# Patient Record
Sex: Male | Born: 1960 | Race: White | Hispanic: No | Marital: Married | Smoking: Former smoker
Health system: Southern US, Community
[De-identification: ages and names within clinical notes are randomized; demographics above are authoritative.]

## PROBLEM LIST (undated history)

## (undated) DIAGNOSIS — G4733 Obstructive sleep apnea (adult) (pediatric): Secondary | ICD-10-CM

## (undated) DIAGNOSIS — J449 Chronic obstructive pulmonary disease, unspecified: Secondary | ICD-10-CM

## (undated) DIAGNOSIS — Z7952 Long term (current) use of systemic steroids: Secondary | ICD-10-CM

## (undated) DIAGNOSIS — M1990 Unspecified osteoarthritis, unspecified site: Secondary | ICD-10-CM

## (undated) DIAGNOSIS — Z9981 Dependence on supplemental oxygen: Secondary | ICD-10-CM

## (undated) DIAGNOSIS — I509 Heart failure, unspecified: Secondary | ICD-10-CM

## (undated) DIAGNOSIS — E119 Type 2 diabetes mellitus without complications: Secondary | ICD-10-CM

## (undated) DIAGNOSIS — S32000S Wedge compression fracture of unspecified lumbar vertebra, sequela: Secondary | ICD-10-CM

## (undated) DIAGNOSIS — I10 Essential (primary) hypertension: Secondary | ICD-10-CM

## (undated) DIAGNOSIS — M810 Age-related osteoporosis without current pathological fracture: Secondary | ICD-10-CM

## (undated) DIAGNOSIS — J431 Panlobular emphysema: Secondary | ICD-10-CM

## (undated) DIAGNOSIS — F329 Major depressive disorder, single episode, unspecified: Secondary | ICD-10-CM

## (undated) DIAGNOSIS — E785 Hyperlipidemia, unspecified: Secondary | ICD-10-CM

## (undated) DIAGNOSIS — R569 Unspecified convulsions: Secondary | ICD-10-CM

---

## 2011-10-22 DIAGNOSIS — G959 Disease of spinal cord, unspecified: Secondary | ICD-10-CM | POA: Insufficient documentation

## 2012-03-06 DIAGNOSIS — E042 Nontoxic multinodular goiter: Secondary | ICD-10-CM | POA: Insufficient documentation

## 2012-03-06 DIAGNOSIS — Z720 Tobacco use: Secondary | ICD-10-CM | POA: Insufficient documentation

## 2012-05-04 DIAGNOSIS — M069 Rheumatoid arthritis, unspecified: Secondary | ICD-10-CM | POA: Diagnosis present

## 2016-04-19 DIAGNOSIS — R410 Disorientation, unspecified: Secondary | ICD-10-CM | POA: Insufficient documentation

## 2016-04-19 DIAGNOSIS — G4733 Obstructive sleep apnea (adult) (pediatric): Secondary | ICD-10-CM | POA: Diagnosis present

## 2016-04-19 DIAGNOSIS — J189 Pneumonia, unspecified organism: Secondary | ICD-10-CM | POA: Insufficient documentation

## 2016-04-19 DIAGNOSIS — J9621 Acute and chronic respiratory failure with hypoxia: Secondary | ICD-10-CM | POA: Diagnosis present

## 2016-04-19 DIAGNOSIS — A419 Sepsis, unspecified organism: Secondary | ICD-10-CM | POA: Insufficient documentation

## 2016-04-19 DIAGNOSIS — J9622 Acute and chronic respiratory failure with hypercapnia: Secondary | ICD-10-CM | POA: Diagnosis present

## 2016-04-28 DIAGNOSIS — E876 Hypokalemia: Secondary | ICD-10-CM | POA: Diagnosis present

## 2016-10-26 DIAGNOSIS — N200 Calculus of kidney: Secondary | ICD-10-CM | POA: Insufficient documentation

## 2016-11-15 DIAGNOSIS — N1330 Unspecified hydronephrosis: Secondary | ICD-10-CM | POA: Insufficient documentation

## 2016-11-16 DIAGNOSIS — L98429 Non-pressure chronic ulcer of back with unspecified severity: Secondary | ICD-10-CM | POA: Insufficient documentation

## 2016-11-16 DIAGNOSIS — R0902 Hypoxemia: Secondary | ICD-10-CM | POA: Insufficient documentation

## 2016-11-16 DIAGNOSIS — J449 Chronic obstructive pulmonary disease, unspecified: Secondary | ICD-10-CM | POA: Diagnosis present

## 2017-01-05 DIAGNOSIS — N390 Urinary tract infection, site not specified: Secondary | ICD-10-CM | POA: Insufficient documentation

## 2017-01-06 DIAGNOSIS — D649 Anemia, unspecified: Secondary | ICD-10-CM | POA: Insufficient documentation

## 2017-02-19 DIAGNOSIS — R1013 Epigastric pain: Secondary | ICD-10-CM | POA: Insufficient documentation

## 2017-03-09 DIAGNOSIS — S22000A Wedge compression fracture of unspecified thoracic vertebra, initial encounter for closed fracture: Secondary | ICD-10-CM | POA: Insufficient documentation

## 2017-03-09 DIAGNOSIS — G894 Chronic pain syndrome: Secondary | ICD-10-CM | POA: Diagnosis present

## 2017-04-20 DIAGNOSIS — I10 Essential (primary) hypertension: Secondary | ICD-10-CM | POA: Diagnosis present

## 2017-04-20 DIAGNOSIS — F329 Major depressive disorder, single episode, unspecified: Secondary | ICD-10-CM | POA: Diagnosis present

## 2017-04-20 DIAGNOSIS — F419 Anxiety disorder, unspecified: Secondary | ICD-10-CM | POA: Diagnosis present

## 2017-05-02 DIAGNOSIS — R911 Solitary pulmonary nodule: Secondary | ICD-10-CM | POA: Insufficient documentation

## 2017-05-13 DIAGNOSIS — N400 Enlarged prostate without lower urinary tract symptoms: Secondary | ICD-10-CM | POA: Insufficient documentation

## 2017-05-13 DIAGNOSIS — D7389 Other diseases of spleen: Secondary | ICD-10-CM | POA: Insufficient documentation

## 2017-05-13 DIAGNOSIS — D735 Infarction of spleen: Secondary | ICD-10-CM | POA: Insufficient documentation

## 2017-08-18 DIAGNOSIS — I5032 Chronic diastolic (congestive) heart failure: Secondary | ICD-10-CM | POA: Insufficient documentation

## 2018-01-06 DIAGNOSIS — R5381 Other malaise: Secondary | ICD-10-CM | POA: Insufficient documentation

## 2018-07-07 DIAGNOSIS — Z7409 Other reduced mobility: Secondary | ICD-10-CM | POA: Insufficient documentation

## 2018-07-07 DIAGNOSIS — Z7952 Long term (current) use of systemic steroids: Secondary | ICD-10-CM | POA: Insufficient documentation

## 2018-07-07 DIAGNOSIS — E785 Hyperlipidemia, unspecified: Secondary | ICD-10-CM | POA: Diagnosis present

## 2018-07-07 DIAGNOSIS — J431 Panlobular emphysema: Secondary | ICD-10-CM | POA: Insufficient documentation

## 2018-07-07 DIAGNOSIS — E1169 Type 2 diabetes mellitus with other specified complication: Secondary | ICD-10-CM | POA: Diagnosis present

## 2018-07-07 DIAGNOSIS — T380X5A Adverse effect of glucocorticoids and synthetic analogues, initial encounter: Secondary | ICD-10-CM | POA: Insufficient documentation

## 2018-07-07 DIAGNOSIS — G40909 Epilepsy, unspecified, not intractable, without status epilepticus: Secondary | ICD-10-CM | POA: Insufficient documentation

## 2018-07-08 DIAGNOSIS — R4702 Dysphasia: Secondary | ICD-10-CM | POA: Insufficient documentation

## 2018-10-21 ENCOUNTER — Other Ambulatory Visit: Payer: Self-pay

## 2018-10-21 ENCOUNTER — Inpatient Hospital Stay (HOSPITAL_COMMUNITY)
Admission: EM | Admit: 2018-10-21 | Discharge: 2018-11-01 | DRG: 177 | Disposition: A | Discharge status: Skilled Nursing Facility | Payer: Medicare Other | Source: Skilled Nursing Facility | Attending: Internal Medicine | Admitting: Internal Medicine

## 2018-10-21 ENCOUNTER — Encounter (HOSPITAL_COMMUNITY): Payer: Self-pay

## 2018-10-21 ENCOUNTER — Emergency Department (HOSPITAL_COMMUNITY): Payer: Medicare Other

## 2018-10-21 DIAGNOSIS — R0902 Hypoxemia: Secondary | ICD-10-CM

## 2018-10-21 DIAGNOSIS — Z888 Allergy status to other drugs, medicaments and biological substances status: Secondary | ICD-10-CM | POA: Diagnosis not present

## 2018-10-21 DIAGNOSIS — R0602 Shortness of breath: Secondary | ICD-10-CM

## 2018-10-21 DIAGNOSIS — D696 Thrombocytopenia, unspecified: Secondary | ICD-10-CM | POA: Diagnosis present

## 2018-10-21 DIAGNOSIS — I110 Hypertensive heart disease with heart failure: Secondary | ICD-10-CM | POA: Diagnosis present

## 2018-10-21 DIAGNOSIS — G4733 Obstructive sleep apnea (adult) (pediatric): Secondary | ICD-10-CM | POA: Diagnosis present

## 2018-10-21 DIAGNOSIS — F419 Anxiety disorder, unspecified: Secondary | ICD-10-CM | POA: Diagnosis present

## 2018-10-21 DIAGNOSIS — M069 Rheumatoid arthritis, unspecified: Secondary | ICD-10-CM | POA: Diagnosis present

## 2018-10-21 DIAGNOSIS — Z79890 Hormone replacement therapy (postmenopausal): Secondary | ICD-10-CM | POA: Diagnosis not present

## 2018-10-21 DIAGNOSIS — J1289 Other viral pneumonia: Secondary | ICD-10-CM | POA: Diagnosis present

## 2018-10-21 DIAGNOSIS — E119 Type 2 diabetes mellitus without complications: Secondary | ICD-10-CM | POA: Diagnosis present

## 2018-10-21 DIAGNOSIS — J069 Acute upper respiratory infection, unspecified: Secondary | ICD-10-CM | POA: Diagnosis not present

## 2018-10-21 DIAGNOSIS — Z87891 Personal history of nicotine dependence: Secondary | ICD-10-CM | POA: Diagnosis not present

## 2018-10-21 DIAGNOSIS — D72819 Decreased white blood cell count, unspecified: Secondary | ICD-10-CM | POA: Diagnosis present

## 2018-10-21 DIAGNOSIS — Z7952 Long term (current) use of systemic steroids: Secondary | ICD-10-CM

## 2018-10-21 DIAGNOSIS — Z882 Allergy status to sulfonamides status: Secondary | ICD-10-CM

## 2018-10-21 DIAGNOSIS — I5032 Chronic diastolic (congestive) heart failure: Secondary | ICD-10-CM | POA: Diagnosis present

## 2018-10-21 DIAGNOSIS — Z9981 Dependence on supplemental oxygen: Secondary | ICD-10-CM | POA: Diagnosis not present

## 2018-10-21 DIAGNOSIS — J9621 Acute and chronic respiratory failure with hypoxia: Secondary | ICD-10-CM | POA: Diagnosis present

## 2018-10-21 DIAGNOSIS — J449 Chronic obstructive pulmonary disease, unspecified: Secondary | ICD-10-CM | POA: Diagnosis present

## 2018-10-21 DIAGNOSIS — E1169 Type 2 diabetes mellitus with other specified complication: Secondary | ICD-10-CM | POA: Diagnosis present

## 2018-10-21 DIAGNOSIS — Z7401 Bed confinement status: Secondary | ICD-10-CM | POA: Diagnosis not present

## 2018-10-21 DIAGNOSIS — E669 Obesity, unspecified: Secondary | ICD-10-CM | POA: Diagnosis present

## 2018-10-21 DIAGNOSIS — Z794 Long term (current) use of insulin: Secondary | ICD-10-CM

## 2018-10-21 DIAGNOSIS — M0579 Rheumatoid arthritis with rheumatoid factor of multiple sites without organ or systems involvement: Secondary | ICD-10-CM | POA: Diagnosis not present

## 2018-10-21 DIAGNOSIS — Y95 Nosocomial condition: Secondary | ICD-10-CM | POA: Diagnosis not present

## 2018-10-21 DIAGNOSIS — F32A Depression, unspecified: Secondary | ICD-10-CM | POA: Diagnosis present

## 2018-10-21 DIAGNOSIS — J9622 Acute and chronic respiratory failure with hypercapnia: Secondary | ICD-10-CM | POA: Diagnosis present

## 2018-10-21 DIAGNOSIS — J431 Panlobular emphysema: Secondary | ICD-10-CM | POA: Diagnosis present

## 2018-10-21 DIAGNOSIS — Z7982 Long term (current) use of aspirin: Secondary | ICD-10-CM

## 2018-10-21 DIAGNOSIS — G894 Chronic pain syndrome: Secondary | ICD-10-CM | POA: Diagnosis present

## 2018-10-21 DIAGNOSIS — J1282 Pneumonia due to coronavirus disease 2019: Secondary | ICD-10-CM | POA: Diagnosis present

## 2018-10-21 DIAGNOSIS — Z79899 Other long term (current) drug therapy: Secondary | ICD-10-CM

## 2018-10-21 DIAGNOSIS — E876 Hypokalemia: Secondary | ICD-10-CM | POA: Diagnosis present

## 2018-10-21 DIAGNOSIS — I10 Essential (primary) hypertension: Secondary | ICD-10-CM | POA: Diagnosis present

## 2018-10-21 DIAGNOSIS — J9801 Acute bronchospasm: Secondary | ICD-10-CM | POA: Diagnosis not present

## 2018-10-21 DIAGNOSIS — F329 Major depressive disorder, single episode, unspecified: Secondary | ICD-10-CM | POA: Diagnosis present

## 2018-10-21 DIAGNOSIS — E785 Hyperlipidemia, unspecified: Secondary | ICD-10-CM | POA: Diagnosis present

## 2018-10-21 DIAGNOSIS — U071 COVID-19: Principal | ICD-10-CM | POA: Diagnosis present

## 2018-10-21 HISTORY — DX: Type 2 diabetes mellitus without complications: E11.9

## 2018-10-21 HISTORY — DX: Chronic obstructive pulmonary disease, unspecified: J44.9

## 2018-10-21 HISTORY — DX: Heart failure, unspecified: I50.9

## 2018-10-21 HISTORY — DX: Major depressive disorder, single episode, unspecified: F32.9

## 2018-10-21 HISTORY — DX: Essential (primary) hypertension: I10

## 2018-10-21 HISTORY — DX: Hyperlipidemia, unspecified: E78.5

## 2018-10-21 HISTORY — DX: Wedge compression fracture of unspecified lumbar vertebra, sequela: S32.000S

## 2018-10-21 HISTORY — DX: Panlobular emphysema: J43.1

## 2018-10-21 HISTORY — DX: Unspecified osteoarthritis, unspecified site: M19.90

## 2018-10-21 HISTORY — DX: Obstructive sleep apnea (adult) (pediatric): G47.33

## 2018-10-21 HISTORY — DX: Dependence on supplemental oxygen: Z99.81

## 2018-10-21 HISTORY — DX: Age-related osteoporosis without current pathological fracture: M81.0

## 2018-10-21 HISTORY — DX: Long term (current) use of systemic steroids: Z79.52

## 2018-10-21 HISTORY — DX: Unspecified convulsions: R56.9

## 2018-10-21 LAB — CBC WITH DIFFERENTIAL/PLATELET
Abs Immature Granulocytes: 0.02 10*3/uL (ref 0.00–0.07)
Basophils Absolute: 0 10*3/uL (ref 0.0–0.1)
Basophils Relative: 1 %
Eosinophils Absolute: 0 10*3/uL (ref 0.0–0.5)
Eosinophils Relative: 0 %
HCT: 63.9 % — ABNORMAL HIGH (ref 39.0–52.0)
Hemoglobin: 20.3 g/dL — ABNORMAL HIGH (ref 13.0–17.0)
Immature Granulocytes: 1 %
Lymphocytes Relative: 6 %
Lymphs Abs: 0.2 10*3/uL — ABNORMAL LOW (ref 0.7–4.0)
MCH: 28 pg (ref 26.0–34.0)
MCHC: 31.8 g/dL (ref 30.0–36.0)
MCV: 88 fL (ref 80.0–100.0)
Monocytes Absolute: 0.2 10*3/uL (ref 0.1–1.0)
Monocytes Relative: 7 %
Neutro Abs: 2.9 10*3/uL (ref 1.7–7.7)
Neutrophils Relative %: 85 %
Platelets: 64 10*3/uL — ABNORMAL LOW (ref 150–400)
RBC: 7.26 MIL/uL — ABNORMAL HIGH (ref 4.22–5.81)
RDW: 17.2 % — ABNORMAL HIGH (ref 11.5–15.5)
WBC: 3.4 10*3/uL — ABNORMAL LOW (ref 4.0–10.5)
nRBC: 0 % (ref 0.0–0.2)

## 2018-10-21 LAB — CBG MONITORING, ED: Glucose-Capillary: 239 mg/dL — ABNORMAL HIGH (ref 70–99)

## 2018-10-21 LAB — C-REACTIVE PROTEIN: CRP: 14.4 mg/dL — ABNORMAL HIGH (ref <1.0)

## 2018-10-21 LAB — COMPREHENSIVE METABOLIC PANEL
ALT: 26 U/L (ref 0–44)
AST: 31 U/L (ref 15–41)
Albumin: 2.5 g/dL — ABNORMAL LOW (ref 3.5–5.0)
Alkaline Phosphatase: 55 U/L (ref 38–126)
Anion gap: 12 (ref 5–15)
BUN: 13 mg/dL (ref 6–20)
CO2: 25 mmol/L (ref 22–32)
Calcium: 7.6 mg/dL — ABNORMAL LOW (ref 8.9–10.3)
Chloride: 98 mmol/L (ref 98–111)
Creatinine, Ser: 0.67 mg/dL (ref 0.61–1.24)
GFR calc Af Amer: >60 mL/min (ref >60)
GFR calc non Af Amer: >60 mL/min (ref >60)
Glucose, Bld: 113 mg/dL — ABNORMAL HIGH (ref 70–99)
Potassium: 2.9 mmol/L — ABNORMAL LOW (ref 3.5–5.1)
Sodium: 135 mmol/L (ref 135–145)
Total Bilirubin: 0.6 mg/dL (ref 0.3–1.2)
Total Protein: 5.7 g/dL — ABNORMAL LOW (ref 6.5–8.1)

## 2018-10-21 LAB — LACTIC ACID, PLASMA
Lactic Acid, Venous: 1.1 mmol/L (ref 0.5–1.9)
Lactic Acid, Venous: 0.9 mmol/L (ref 0.5–1.9)

## 2018-10-21 LAB — STREP PNEUMONIAE URINARY ANTIGEN: Strep Pneumo Urinary Antigen: NEGATIVE

## 2018-10-21 LAB — SARS CORONAVIRUS 2 BY RT PCR (HOSPITAL ORDER, PERFORMED IN ~~LOC~~ HOSPITAL LAB): SARS Coronavirus 2: POSITIVE — AB

## 2018-10-21 LAB — ABO/RH: ABO/RH(D): O NEG

## 2018-10-21 LAB — D-DIMER, QUANTITATIVE: D-Dimer, Quant: 2.2 ug/mL-FEU — ABNORMAL HIGH (ref 0.00–0.50)

## 2018-10-21 LAB — SEDIMENTATION RATE: Sed Rate: 2 mm/hr (ref 0–16)

## 2018-10-21 LAB — FERRITIN: Ferritin: 1447 ng/mL — ABNORMAL HIGH (ref 24–336)

## 2018-10-21 LAB — PROCALCITONIN: Procalcitonin: 0.25 ng/mL

## 2018-10-21 MED ORDER — POLYETHYLENE GLYCOL 3350 17 G PO PACK
17.0000 g | PACK | Freq: Every day | ORAL | Status: DC
  Administered 2018-10-25 – 2018-11-01 (×7): 17 g via ORAL
  Filled 2018-10-21 (×10): qty 1

## 2018-10-21 MED ORDER — POTASSIUM CHLORIDE CRYS ER 20 MEQ PO TBCR
40.0000 meq | EXTENDED_RELEASE_TABLET | Freq: Two times a day (BID) | ORAL | Status: DC
  Administered 2018-10-21 – 2018-11-01 (×22): 40 meq via ORAL
  Filled 2018-10-21 (×23): qty 2

## 2018-10-21 MED ORDER — DEXAMETHASONE SODIUM PHOSPHATE 10 MG/ML IJ SOLN
10.0000 mg | Freq: Once | INTRAMUSCULAR | Status: AC
  Administered 2018-10-21: 09:00:00 10 mg via INTRAVENOUS
  Filled 2018-10-21: qty 1

## 2018-10-21 MED ORDER — GABAPENTIN 800 MG PO TABS: 800.0000 mg | ORAL_TABLET | Freq: Two times a day (BID) | ORAL | Status: DC

## 2018-10-21 MED ORDER — INSULIN GLARGINE 100 UNIT/ML ~~LOC~~ SOLN
10.0000 [IU] | Freq: Every day | SUBCUTANEOUS | Status: DC
  Administered 2018-10-22 – 2018-10-30 (×9): 10 [IU] via SUBCUTANEOUS
  Filled 2018-10-21 (×10): qty 0.1

## 2018-10-21 MED ORDER — ENOXAPARIN SODIUM 40 MG/0.4ML ~~LOC~~ SOLN
40.0000 mg | SUBCUTANEOUS | Status: DC
  Administered 2018-10-22 – 2018-10-31 (×10): 40 mg via SUBCUTANEOUS
  Filled 2018-10-21 (×10): qty 0.4

## 2018-10-21 MED ORDER — SODIUM CHLORIDE 0.9 % IV SOLN
INTRAVENOUS | Status: DC
  Administered 2018-10-21 – 2018-10-22 (×2): via INTRAVENOUS

## 2018-10-21 MED ORDER — SODIUM CHLORIDE 0.9 % IV SOLN
2.0000 g | INTRAVENOUS | Status: AC
  Administered 2018-10-21 – 2018-10-23 (×3): 2 g via INTRAVENOUS
  Filled 2018-10-21 (×3): qty 20

## 2018-10-21 MED ORDER — SODIUM CHLORIDE 0.9 % IV SOLN
250.0000 mL | INTRAVENOUS | Status: DC | PRN
  Administered 2018-10-30: 14:00:00 250 mL via INTRAVENOUS

## 2018-10-21 MED ORDER — SODIUM CHLORIDE 0.9 % IV SOLN
100.0000 mg | INTRAVENOUS | Status: AC
  Administered 2018-10-22 – 2018-10-25 (×4): 100 mg via INTRAVENOUS
  Filled 2018-10-21 (×4): qty 20

## 2018-10-21 MED ORDER — POLYVINYL ALCOHOL 1.4 % OP SOLN
2.0000 [drp] | OPHTHALMIC | Status: DC | PRN
  Filled 2018-10-21: qty 15

## 2018-10-21 MED ORDER — GUAIFENESIN-DM 100-10 MG/5ML PO SYRP
10.0000 mL | ORAL_SOLUTION | ORAL | Status: DC | PRN
  Administered 2018-10-24: 05:00:00 10 mL via ORAL
  Filled 2018-10-21 (×2): qty 10

## 2018-10-21 MED ORDER — SODIUM CHLORIDE 0.9 % IV SOLN
200.0000 mg | Freq: Once | INTRAVENOUS | Status: AC
  Administered 2018-10-21: 200 mg via INTRAVENOUS
  Filled 2018-10-21: qty 40

## 2018-10-21 MED ORDER — ASPIRIN 325 MG PO TABS
325.0000 mg | ORAL_TABLET | Freq: Every day | ORAL | Status: DC
  Administered 2018-10-21 – 2018-11-01 (×12): 325 mg via ORAL
  Filled 2018-10-21 (×13): qty 1

## 2018-10-21 MED ORDER — LEVETIRACETAM 250 MG PO TABS
1000.0000 mg | ORAL_TABLET | Freq: Three times a day (TID) | ORAL | Status: DC
  Administered 2018-10-21 – 2018-11-01 (×32): 1000 mg via ORAL
  Filled 2018-10-21 (×20): qty 4
  Filled 2018-10-21: qty 2
  Filled 2018-10-21 (×4): qty 4
  Filled 2018-10-21: qty 2
  Filled 2018-10-21 (×6): qty 4

## 2018-10-21 MED ORDER — CARVEDILOL 6.25 MG PO TABS
6.2500 mg | ORAL_TABLET | Freq: Two times a day (BID) | ORAL | Status: DC
  Administered 2018-10-22 – 2018-10-28 (×13): 6.25 mg via ORAL
  Filled 2018-10-21: qty 2
  Filled 2018-10-21 (×4): qty 1
  Filled 2018-10-21 (×2): qty 2
  Filled 2018-10-21 (×2): qty 1
  Filled 2018-10-21 (×2): qty 2
  Filled 2018-10-21 (×3): qty 1
  Filled 2018-10-21 (×2): qty 2
  Filled 2018-10-21 (×3): qty 1
  Filled 2018-10-21 (×4): qty 2
  Filled 2018-10-21 (×3): qty 1

## 2018-10-21 MED ORDER — MORPHINE SULFATE ER 30 MG PO TBCR
60.0000 mg | EXTENDED_RELEASE_TABLET | Freq: Three times a day (TID) | ORAL | Status: DC
  Administered 2018-10-21 – 2018-11-01 (×33): 60 mg via ORAL
  Filled 2018-10-21 (×31): qty 2
  Filled 2018-10-21: qty 4

## 2018-10-21 MED ORDER — MORPHINE SULFATE (PF) 4 MG/ML IV SOLN
4.0000 mg | Freq: Once | INTRAVENOUS | Status: AC
  Administered 2018-10-21: 4 mg via INTRAVENOUS
  Filled 2018-10-21: qty 1

## 2018-10-21 MED ORDER — DIPHENHYDRAMINE HCL 25 MG PO CAPS: 25.0000 mg | ORAL_CAPSULE | Freq: Every day | ORAL | Status: DC | PRN

## 2018-10-21 MED ORDER — ALBUTEROL SULFATE HFA 108 (90 BASE) MCG/ACT IN AERS
2.0000 | INHALATION_SPRAY | RESPIRATORY_TRACT | Status: DC | PRN
  Administered 2018-10-23 – 2018-10-30 (×11): 2 via RESPIRATORY_TRACT
  Filled 2018-10-21: qty 6.7

## 2018-10-21 MED ORDER — SODIUM CHLORIDE 0.9% FLUSH: 3.0000 mL | INTRAVENOUS | Status: DC | PRN

## 2018-10-21 MED ORDER — LACTULOSE 10 GM/15ML PO SOLN
20.0000 g | Freq: Two times a day (BID) | ORAL | Status: DC
  Administered 2018-10-22 – 2018-11-01 (×18): 20 g via ORAL
  Filled 2018-10-21 (×18): qty 30

## 2018-10-21 MED ORDER — HYDROCOD POLST-CPM POLST ER 10-8 MG/5ML PO SUER
5.0000 mL | Freq: Two times a day (BID) | ORAL | Status: DC | PRN
  Administered 2018-10-23: 5 mL via ORAL
  Filled 2018-10-21: qty 5

## 2018-10-21 MED ORDER — ONDANSETRON HCL 4 MG/2ML IJ SOLN
4.0000 mg | Freq: Four times a day (QID) | INTRAMUSCULAR | Status: DC | PRN
  Administered 2018-10-22 – 2018-10-31 (×6): 4 mg via INTRAVENOUS
  Filled 2018-10-21 (×6): qty 2

## 2018-10-21 MED ORDER — GABAPENTIN 400 MG PO CAPS
1600.0000 mg | ORAL_CAPSULE | Freq: Every day | ORAL | Status: DC
  Administered 2018-10-22 – 2018-10-31 (×10): 1600 mg via ORAL
  Filled 2018-10-21 (×12): qty 4

## 2018-10-21 MED ORDER — INSULIN ASPART 100 UNIT/ML ~~LOC~~ SOLN
0.0000 [IU] | Freq: Three times a day (TID) | SUBCUTANEOUS | Status: DC
  Administered 2018-10-22: 2 [IU] via SUBCUTANEOUS
  Administered 2018-10-22: 1 [IU] via SUBCUTANEOUS
  Administered 2018-10-22 – 2018-10-23 (×2): 3 [IU] via SUBCUTANEOUS
  Administered 2018-10-23 (×2): 2 [IU] via SUBCUTANEOUS
  Administered 2018-10-24: 3 [IU] via SUBCUTANEOUS
  Administered 2018-10-24: 12:00:00 2 [IU] via SUBCUTANEOUS
  Administered 2018-10-25 (×2): 3 [IU] via SUBCUTANEOUS
  Administered 2018-10-26: 2 [IU] via SUBCUTANEOUS
  Administered 2018-10-26: 7 [IU] via SUBCUTANEOUS
  Administered 2018-10-27: 13:00:00 2 [IU] via SUBCUTANEOUS
  Administered 2018-10-27: 5 [IU] via SUBCUTANEOUS
  Administered 2018-10-27 – 2018-10-28 (×2): 1 [IU] via SUBCUTANEOUS
  Filled 2018-10-21: qty 0.09

## 2018-10-21 MED ORDER — ONDANSETRON HCL 4 MG PO TABS
4.0000 mg | ORAL_TABLET | Freq: Four times a day (QID) | ORAL | Status: DC | PRN
  Administered 2018-10-22: 4 mg via ORAL
  Filled 2018-10-21: qty 1

## 2018-10-21 MED ORDER — MORPHINE SULFATE 30 MG PO TABS
30.0000 mg | ORAL_TABLET | ORAL | Status: DC | PRN
  Filled 2018-10-21: qty 1

## 2018-10-21 MED ORDER — INSULIN ASPART 100 UNIT/ML ~~LOC~~ SOLN
0.0000 [IU] | Freq: Every day | SUBCUTANEOUS | Status: DC
  Administered 2018-10-24 – 2018-10-25 (×2): 2 [IU] via SUBCUTANEOUS
  Filled 2018-10-21: qty 0.05

## 2018-10-21 MED ORDER — SERTRALINE HCL 50 MG PO TABS
100.0000 mg | ORAL_TABLET | Freq: Every day | ORAL | Status: DC
  Administered 2018-10-21 – 2018-11-01 (×12): 100 mg via ORAL
  Filled 2018-10-21 (×12): qty 2

## 2018-10-21 MED ORDER — TIZANIDINE HCL 4 MG PO TABS
4.0000 mg | ORAL_TABLET | Freq: Three times a day (TID) | ORAL | Status: DC | PRN
  Administered 2018-10-31 – 2018-11-01 (×4): 4 mg via ORAL
  Filled 2018-10-21 (×6): qty 1

## 2018-10-21 MED ORDER — DEXAMETHASONE SODIUM PHOSPHATE 10 MG/ML IJ SOLN
10.0000 mg | INTRAMUSCULAR | Status: DC
  Administered 2018-10-22 – 2018-10-25 (×4): 10 mg via INTRAVENOUS
  Filled 2018-10-21 (×4): qty 1

## 2018-10-21 MED ORDER — DEXAMETHASONE SODIUM PHOSPHATE 10 MG/ML IJ SOLN: 10.0000 mg | Freq: Two times a day (BID) | INTRAMUSCULAR | Status: DC

## 2018-10-21 MED ORDER — SODIUM CHLORIDE 0.9% FLUSH
3.0000 mL | Freq: Two times a day (BID) | INTRAVENOUS | Status: DC
  Administered 2018-10-21 – 2018-11-01 (×15): 3 mL via INTRAVENOUS

## 2018-10-21 MED ORDER — BACLOFEN 5 MG HALF TABLET
5.0000 mg | ORAL_TABLET | Freq: Three times a day (TID) | ORAL | Status: DC
  Administered 2018-10-21 – 2018-10-27 (×17): 5 mg via ORAL
  Filled 2018-10-21 (×22): qty 1

## 2018-10-21 MED ORDER — DIAZEPAM 5 MG PO TABS: 5.0000 mg | ORAL_TABLET | Freq: Two times a day (BID) | ORAL | Status: DC | PRN

## 2018-10-21 MED ORDER — TOCILIZUMAB 400 MG/20ML IV SOLN: 600.0000 mg | Freq: Once | INTRAVENOUS | Status: DC

## 2018-10-21 MED ORDER — ATORVASTATIN CALCIUM 40 MG PO TABS
40.0000 mg | ORAL_TABLET | Freq: Every day | ORAL | Status: DC
  Administered 2018-10-22 – 2018-11-01 (×11): 40 mg via ORAL
  Filled 2018-10-21 (×11): qty 1

## 2018-10-21 MED ORDER — SODIUM CHLORIDE 0.9 % IV SOLN
500.0000 mg | INTRAVENOUS | Status: DC
  Administered 2018-10-21 – 2018-10-22 (×2): 500 mg via INTRAVENOUS
  Filled 2018-10-21 (×2): qty 500

## 2018-10-21 MED ORDER — ONDANSETRON HCL 4 MG/2ML IJ SOLN
4.0000 mg | Freq: Once | INTRAMUSCULAR | Status: AC
  Administered 2018-10-21: 4 mg via INTRAVENOUS
  Filled 2018-10-21: qty 2

## 2018-10-21 MED ORDER — VENLAFAXINE HCL 75 MG PO TABS
75.0000 mg | ORAL_TABLET | Freq: Three times a day (TID) | ORAL | Status: DC
  Administered 2018-10-22 – 2018-11-01 (×30): 75 mg via ORAL
  Filled 2018-10-21 (×39): qty 1

## 2018-10-21 NOTE — ED Triage Notes (Signed)
Pt arrived via [facility] from [place] home, pt arrived with shortness of breath. Pt has covid-19 and pneumonia. Pt currently on non rebreather, oxygen saturation at 94%

## 2018-10-21 NOTE — Progress Notes (Signed)
Pharmacy Brief Note  O:  ALT: 26 CXR: consistent with PNA SpO2: 98 % on 10L [place] (4L @ baseline)  A/P:  Patient meets criteria for remdesevir therapy. Will initiate remdesivir 200 mg iv once followed by 100 mg iv daily x 4 days. Will f/u ALT.   Napoleon Form, [facility] 10/21/18 3:54 PM

## 2018-10-21 NOTE — ED Notes (Signed)
Called report [facility]  Called [facility]  Paper work complete

## 2018-10-21 NOTE — Progress Notes (Signed)
Attempted to place pt on high flow [place] 15 L o2 saturations dropped to 85 pts rr 33-35. Placed back on NRB left High flow at bedside.

## 2018-10-21 NOTE — ED Notes (Signed)
Pt taken off of NRB, switched to hi-flo O2 [place] @ 15 lpm

## 2018-10-21 NOTE — ED Notes (Signed)
ED TO INPATIENT HANDOFF REPORT  ED Nurse Name and Phone #: jon wled   S Name/Age/Gender Luke Phillips 58 y.o. male Room/Bed: WA18/WA18  Code Status   Code Status: Full Code  Home/SNF/Other Skilled nursing facility {Patient oriented  Is this baseline? Yes   Triage Complete: Triage complete  Chief Complaint COVID positive  Triage Note [facility], pt arrived with shortness of breath. Pt has covid-19 and pneumonia. Pt currently on non rebreather, oxygen saturation at 94%    Allergies Allergies  Allergen Reactions  . Sulfasalazine Itching, Rash and Swelling  . Methotrexate Derivatives     Level of Care/Admitting Diagnosis ED Disposition    ED Disposition Condition Comment   Admit  [facility] [[id number]]  Level of Care: Stepdown [14]  Admit to SDU based on following criteria: Respiratory Distress:  Frequent assessment and/or intervention to maintain adequate ventilation/respiration, pulmonary toilet, and respiratory treatment.  Covid Evaluation: Confirmed COVID Positive  Diagnosis: Acute respiratory disease due to COVID-19 virus [[id number]][[phone]]  Admitting Physician: Dorcas CarrowGHIMIRE, KUBER [[id number]]  Attending Physician: Dorcas CarrowGHIMIRE, KUBER [[id number]]  Estimated length of stay: past midnight tomorrow  Certification:: I certify this patient will need inpatient services for at least 2 midnights  PT Class (Do Not Modify): Inpatient [101]  PT Acc Code (Do Not Modify): Private [1]       B Medical/Surgery History Past Medical History:  Diagnosis Date  . Arthritis    rheumatoid  . CHF (congestive heart failure) (HCC)   . Chronic obstructive pulmonary disease (COPD) (HCC)   . Dependence on continuous supplemental oxygen    4 lpm  . Diabetes mellitus without complication (HCC)   . Hyperlipemia   . Hypertension   . Long term current use of systemic steroids   . MDD (major depressive disorder)   . OSA  (obstructive sleep apnea)   . Osteoporosis   . Panlobular emphysema (HCC)   . Seizures (HCC)   . Wedge compression fracture of unspecified lumbar vertebra, sequela    History reviewed. No pertinent surgical history.   A IV Location/Drains/Wounds Patient Lines/Drains/Airways Status   Active Line/Drains/Airways    Name:   Placement date:   Placement time:   Site:   Days:   Implanted Port 10/21/18 Right Chest   10/21/18    0902    Chest   less than 1          Intake/Output Last 24 hours  Intake/Output Summary (Last 24 hours) at 10/21/2018 2041 Last data filed at 10/21/2018 1615 Gross per 24 hour  Intake 180 ml  Output -  Net 180 ml    Labs/Imaging Results for orders placed or performed during the hospital encounter of 10/21/18 (from the past 48 hour(s))  Culture, blood (routine x 2)     Status: None (Preliminary result)   Collection Time: 10/21/18  8:55 AM   Specimen: BLOOD  Result Value Ref Range   Specimen Description      BLOOD PORTA CATH Performed at [facility], [address] [place], [place] [address]    Special Requests      BOTTLES DRAWN AEROBIC AND ANAEROBIC Blood Culture adequate volume Performed at [facility], [address] [place], [place] [address]    Culture      NO GROWTH <12 HOURS Performed at [facility], [address] [place], [place] [address]    Report Status PENDING   Comprehensive  metabolic panel     Status: Abnormal   Collection Time: 10/21/18  8:56 AM  Result Value Ref Range   Sodium 135 135 - 145 mmol/L   Potassium 2.9 (L) 3.5 - 5.1 mmol/L   Chloride 98 98 - 111 mmol/L   CO2 25 22 - 32 mmol/L   Glucose, Bld 113 (H) 70 - 99 mg/dL   BUN 13 6 - 20 mg/dL   Creatinine, Ser 5.40 0.61 - 1.24 mg/dL   Calcium 7.6 (L) 8.9 - 10.3 mg/dL   Total Protein 5.7 (L) 6.5 - 8.1 g/dL   Albumin 2.5 (L) 3.5 - 5.0 g/dL   AST 31 15 - 41 U/L   ALT 26 0 - 44 U/L   Alkaline Phosphatase 55 38 - 126 U/L    Total Bilirubin 0.6 0.3 - 1.2 mg/dL   GFR calc non Af Amer >60 >60 mL/min   GFR calc Af Amer >60 >60 mL/min   Anion gap 12 5 - 15    Comment: Performed at [facility], [address], [place], [place] [address]  CBC with Differential/Platelet     Status: Abnormal   Collection Time: 10/21/18  8:56 AM  Result Value Ref Range   WBC 3.4 (L) 4.0 - 10.5 K/uL   RBC 7.26 (H) 4.22 - 5.81 MIL/uL   Hemoglobin 20.3 (H) 13.0 - 17.0 g/dL   HCT 14.7 (H) 82.9 - 56.2 %   MCV 88.0 80.0 - 100.0 fL   MCH 28.0 26.0 - 34.0 pg   MCHC 31.8 30.0 - 36.0 g/dL   RDW 13.0 (H) 86.5 - 78.4 %   Platelets 64 (L) 150 - 400 K/uL    Comment: REPEATED TO VERIFY PLATELET COUNT CONFIRMED BY SMEAR SPECIMEN CHECKED FOR CLOTS Immature Platelet Fraction may be clinically indicated, consider ordering this additional test [id number]    nRBC 0.0 0.0 - 0.2 %   Neutrophils Relative % 85 %   Neutro Abs 2.9 1.7 - 7.7 K/uL   Lymphocytes Relative 6 %   Lymphs Abs 0.2 (L) 0.7 - 4.0 K/uL   Monocytes Relative 7 %   Monocytes Absolute 0.2 0.1 - 1.0 K/uL   Eosinophils Relative 0 %   Eosinophils Absolute 0.0 0.0 - 0.5 K/uL   Basophils Relative 1 %   Basophils Absolute 0.0 0.0 - 0.1 K/uL   Immature Granulocytes 1 %   Abs Immature Granulocytes 0.02 0.00 - 0.07 K/uL    Comment: Performed at [facility], [address] [place], [place] [address]  Ferritin     Status: Abnormal   Collection Time: 10/21/18  8:56 AM  Result Value Ref Range   Ferritin 1,447 (H) 24 - 336 ng/mL    Comment: Performed at [facility], [address] [place], [place] [address]  Sedimentation rate     Status: None   Collection Time: 10/21/18  8:56 AM  Result Value Ref Range   Sed Rate 2 0 - 16 mm/hr    Comment: Performed at [facility], [address], [place], [place] [address]  Culture, blood (routine x 2)     Status: None (Preliminary result)   Collection Time: 10/21/18   8:56 AM   Specimen: BLOOD  Result Value Ref Range   Specimen Description      BLOOD PORTA CATH Performed at [facility], [address], [place], [place] [address]    Special Requests      BOTTLES  DRAWN AEROBIC AND ANAEROBIC Blood Culture adequate volume Performed at [facility], [address], [place], [place] [address]    Culture      NO GROWTH <12 HOURS Performed at [facility], [address] [place], [place] [address]    Report Status PENDING   Lactic acid, plasma     Status: None   Collection Time: 10/21/18  8:56 AM  Result Value Ref Range   Lactic Acid, Venous 1.1 0.5 - 1.9 mmol/L    Comment: Performed at [facility], [address], [place], [place] [address]  C-reactive protein     Status: Abnormal   Collection Time: 10/21/18  8:56 AM  Result Value Ref Range   CRP 14.4 (H) <1.0 mg/dL    Comment: Performed at [facility], [address], [place], [place] [address]  Lactic acid, plasma     Status: None   Collection Time: 10/21/18 10:00 AM  Result Value Ref Range   Lactic Acid, Venous 0.9 0.5 - 1.9 mmol/L    Comment: Performed at [facility], [address] [place], [place] [address]  Procalcitonin     Status: None   Collection Time: 10/21/18 10:00 AM  Result Value Ref Range   Procalcitonin 0.25 ng/mL    Comment:        Interpretation: PCT (Procalcitonin) <= 0.5 ng/mL: Systemic infection (sepsis) is not likely. Local bacterial infection is possible. (NOTE)       Sepsis PCT Algorithm           Lower Respiratory Tract                                      Infection PCT Algorithm    ----------------------------     ----------------------------         PCT < 0.25 ng/mL                PCT < 0.10 ng/mL         Strongly encourage             Strongly discourage   discontinuation of antibiotics    initiation of antibiotics    ----------------------------      -----------------------------       PCT 0.25 - 0.50 ng/mL            PCT 0.10 - 0.25 ng/mL               OR       >80% decrease in PCT            Discourage initiation of                                            antibiotics      Encourage discontinuation           of antibiotics    ----------------------------     -----------------------------         PCT >= 0.50 ng/mL              PCT 0.26 - 0.50 ng/mL               AND        <80% decrease in PCT  Encourage initiation of                                             antibiotics       Encourage continuation           of antibiotics    ----------------------------     -----------------------------        PCT >= 0.50 ng/mL                  PCT > 0.50 ng/mL               AND         increase in PCT                  Strongly encourage                                      initiation of antibiotics    Strongly encourage escalation           of antibiotics                                     -----------------------------                                           PCT <= 0.25 ng/mL                                                 OR                                        > 80% decrease in PCT                                     Discontinue / Do not initiate                                             antibiotics Performed at [facility], [address] [place], [place] [address]   D-dimer, quantitative (not at [facility])     Status: Abnormal   Collection Time: 10/21/18 10:00 AM  Result Value Ref Range   D-Dimer, Quant 2.20 (H) 0.00 - 0.50 ug/mL-FEU    Comment: SPECIMEN COLLECTED IN ANTICOAGULANT ADJUSTED TUBE DUE TO ELEVATED HEMATOCRIT (NOTE) At the manufacturer cut-off of 0.50 ug/mL FEU, this assay has been documented to exclude PE with a sensitivity and negative predictive value of 97 to 99%.  At this time, this assay has not been approved by the FDA to exclude DVT/VTE. Results should be correlated with  clinical presentation. Performed at [facility], [address] [place], [place]  [address]   SARS Coronavirus 2 (CEPHEID - Performed in [place] hospital lab), Hosp Order     Status: Abnormal   Collection Time: 10/21/18  4:54 PM   Specimen: Nasopharyngeal Swab  Result Value Ref Range   SARS Coronavirus 2 POSITIVE (A) NEGATIVE    Comment: RESULT CALLED TO, READ BACK BY AND VERIFIED WITH: Zymir Napoli,J. RN @1920  ON 07.24.2020 BY COHEN,K (NOTE) If result is NEGATIVE SARS-CoV-2 target nucleic acids are NOT DETECTED. The SARS-CoV-2 RNA is generally detectable in upper and lower  respiratory specimens during the acute phase of infection. The lowest  concentration of SARS-CoV-2 viral copies this assay can detect is 250  copies / mL. A negative result does not preclude SARS-CoV-2 infection  and should not be used as the sole basis for treatment or other  patient management decisions.  A negative result may occur with  improper specimen collection / handling, submission of specimen other  than nasopharyngeal swab, presence of viral mutation(s) within the  areas targeted by this assay, and inadequate number of viral copies  (<250 copies / mL). A negative result must be combined with clinical  observations, patient history, and epidemiological information. If result is POSITIVE SARS-CoV-2 target nucleic acids are DET ECTED. The SARS-CoV-2 RNA is generally detectable in upper and lower  respiratory specimens during the acute phase of infection.  Positive  results are indicative of active infection with SARS-CoV-2.  Clinical  correlation with patient history and other diagnostic information is  necessary to determine patient infection status.  Positive results do  not rule out bacterial infection or co-infection with other viruses. If result is PRESUMPTIVE POSTIVE SARS-CoV-2 nucleic acids MAY BE PRESENT.   A presumptive positive result was obtained on the submitted specimen   and confirmed on repeat testing.  While 2019 novel coronavirus  (SARS-CoV-2) nucleic acids may be present in the submitted sample  additional confirmatory testing may be necessary for epidemiological  and / or clinical management purposes  to differentiate between  SARS-CoV-2 and other Sarbecovirus currently known to infect humans.  If clinically indicated additional testing with an alternate test  methodology ([place]) is advised. The SARS-CoV-2 RNA is generally  detectable in upper and lower respiratory specimens during the acute  phase of infection. The expected result is Negative. Fact Sheet for Patients:  StrictlyIdeas.no Fact Sheet for Healthcare Providers: BankingDealers.co.za This test is not yet approved or cleared by the [place] FDA and has been authorized for detection and/or diagnosis of SARS-CoV-2 by FDA under an Emergency Use Authorization (EUA).  This EUA will remain in effect (meaning this test can be used) for the duration of the COVID-19 declaration under Section 564(b)(1) of the Act, 21 U.S.C. section 360bbb-3(b)(1), unless the authorization is terminated or revoked sooner. Performed at [facility], [address] [place], [place] [address]    Portable Chest 1 View  Result Date: 10/21/2018 CLINICAL DATA:  Shortness of breath. COVID 19 pneumonia. EXAM: PORTABLE CHEST 1 VIEW COMPARISON:  None. FINDINGS: Injectable port terminates at the cavoatrial junction Cardiomediastinal silhouette is enlarged. Calcific atherosclerotic disease of aorta. Left lower lobe atelectasis versus airspace consolidation. Healing or healed right-sided rib fractures. Soft tissues are grossly normal. IMPRESSION: 1. Left lower lobe atelectasis versus airspace consolidation. 2. Enlarged cardiac silhouette. Calcific atherosclerotic disease of aorta. Electronically Signed   By: Fidela Salisbury M.D.   On: 10/21/2018 08:15     Pending Labs Unresulted Labs (From admission, onward)    Start  Ordered   10/22/18 0500  CBC with Differential/Platelet  Daily,   R     10/21/18 1428   10/22/18 0500  Comprehensive metabolic panel  Daily,   R     10/21/18 1428   10/22/18 0500  C-reactive protein  Daily,   R     10/21/18 1428   10/22/18 0500  CK  Daily,   R     10/21/18 1428   10/22/18 0500  D-dimer, quantitative (not at [facility])  Daily,   R     10/21/18 1428   10/22/18 0500  Ferritin  Daily,   R     10/21/18 1428   10/22/18 0500  Magnesium  Daily,   R     10/21/18 1428   10/22/18 0500  Phosphorus  Daily,   R     10/21/18 1428   10/21/18 1429  ABO/Rh  Once,   STAT     10/21/18 1428   10/21/18 1429  Culture, sputum-assessment  Once,   R     10/21/18 1428   10/21/18 1429  Legionella Pneumophila Serogp 1 Ur Ag  Once,   STAT     10/21/18 1428   10/21/18 1429  Strep pneumoniae urinary antigen  Once,   STAT     10/21/18 1428   10/21/18 0712  HIV antibody (Routine Testing)  Once,   STAT     10/21/18 0715          Vitals/Pain Today's Vitals   10/21/18 1800 10/21/18 1830 10/21/18 1845 10/21/18 1900  BP: 128/81 124/80  124/78  Pulse: 88 89 86 84  Resp: 16 (!) 21 18 14   Temp:      TempSrc:      SpO2: 98% 98% 97% 98%  Weight:      Height:      PainSc:        Isolation Precautions Airborne and Contact precautions  Medications Medications  guaiFENesin-dextromethorphan (ROBITUSSIN DM) 100-10 MG/5ML syrup 10 mL (has no administration in time range)  chlorpheniramine-HYDROcodone (TUSSIONEX) 10-8 MG/5ML suspension 5 mL (has no administration in time range)  0.9 %  sodium chloride infusion ( Intravenous Transfusing/Transfer 10/21/18 1622)  potassium chloride SA (K-DUR) CR tablet 40 mEq (40 mEq Oral Given 10/21/18 1541)  aspirin tablet 325 mg (325 mg Oral Given 10/21/18 1620)  morphine (MS CONTIN) 12 hr tablet 60 mg (60 mg Oral Given 10/21/18 1620)  morphine (MSIR) tablet 30 mg (has no administration in time  range)  atorvastatin (LIPITOR) tablet 40 mg (has no administration in time range)  carvedilol (COREG) tablet 6.25 mg (has no administration in time range)  diazepam (VALIUM) tablet 5 mg (has no administration in time range)  sertraline (ZOLOFT) tablet 100 mg (has no administration in time range)  venlafaxine (EFFEXOR) tablet 75 mg (has no administration in time range)  insulin glargine (LANTUS) injection 10 Units (has no administration in time range)  lactulose (CHRONULAC) 10 GM/15ML solution 20 g (has no administration in time range)  polyethylene glycol (MIRALAX / GLYCOLAX) packet 17 g (has no administration in time range)  baclofen (LIORESAL) tablet 5 mg (has no administration in time range)  gabapentin (NEURONTIN) tablet 1,600 mg (has no administration in time range)  gabapentin (NEURONTIN) tablet 800 mg (has no administration in time range)  levETIRAcetam (KEPPRA) tablet 1,000 mg (1,000 mg Oral Given 10/21/18 1620)  tiZANidine (ZANAFLEX) tablet 4 mg (has no administration in time range)  albuterol (VENTOLIN HFA) 108 (90 Base) MCG/ACT inhaler 2 puff (  has no administration in time range)  diphenhydrAMINE (BENADRYL) tablet 25 mg (has no administration in time range)  polyvinyl alcohol (LIQUIFILM TEARS) 1.4 % ophthalmic solution 2 drop (has no administration in time range)  insulin aspart (novoLOG) injection 0-9 Units (has no administration in time range)  insulin aspart (novoLOG) injection 0-5 Units (has no administration in time range)  enoxaparin (LOVENOX) injection 40 mg (has no administration in time range)  sodium chloride flush (NS) 0.9 % injection 3 mL (has no administration in time range)  sodium chloride flush (NS) 0.9 % injection 3 mL (has no administration in time range)  0.9 %  sodium chloride infusion (has no administration in time range)  ondansetron (ZOFRAN) tablet 4 mg (has no administration in time range)    Or  ondansetron (ZOFRAN) injection 4 mg (has no administration in  time range)  cefTRIAXone (ROCEPHIN) 2 g in sodium chloride 0.9 % 100 mL IVPB (0 g Intravenous Stopped 10/21/18 1645)  azithromycin (ZITHROMAX) 500 mg in sodium chloride 0.9 % 250 mL IVPB (0 mg Intravenous Stopped 10/21/18 1559)  dexamethasone (DECADRON) injection 10 mg (has no administration in time range)  remdesivir 200 mg in sodium chloride 0.9 % 250 mL IVPB (0 mg Intravenous Stopped 10/21/18 1759)    Followed by  remdesivir 100 mg in sodium chloride 0.9 % 250 mL IVPB (has no administration in time range)  dexamethasone (DECADRON) injection 10 mg (10 mg Intravenous Given 10/21/18 0905)  morphine 4 MG/ML injection 4 mg (4 mg Intravenous Given 10/21/18 1000)  ondansetron (ZOFRAN) injection 4 mg (4 mg Intravenous Given 10/21/18 1000)    Mobility non-ambulatory High fall risk   Focused Assessments Pulmonary Assessment Handoff:  Lung sounds: Bilateral Breath Sounds: Diminished O2 Device: Nasal Cannula O2 Flow Rate (L/min): 10 L/min      R Recommendations: See Admitting Provider Note  Report given to:   Additional Notes:  Full assistance needed for ADL

## 2018-10-21 NOTE — ED Notes (Signed)
Have attempted to get record of positive COVID test from [facility] facility,was told they had it and were going to fax it over. Will reach out again if not received soon.

## 2018-10-21 NOTE — ED Notes (Signed)
[place] titrated down to 10 lpm

## 2018-10-21 NOTE — ED Notes (Signed)
ED TO INPATIENT HANDOFF REPORT  ED Nurse Name and Phone #: Donah Driver, RN  S Name/Age/Gender Luke Phillips 58 y.o. male Room/Bed: WA18/WA18  Code Status   Code Status: Full Code  Home/SNF/Other Nursing Home Patient oriented to: self, place, time and situation Is this baseline? Yes   Triage Complete: Triage complete  Chief Complaint COVID positive  Triage Note Pt arrived [facility] from [facility] [facility], pt arrived with shortness of breath. Pt has covid-19 and pneumonia. Pt currently on non rebreather, oxygen saturation at 94%    Allergies Allergies  Allergen Reactions  . Sulfasalazine Itching, Rash and Swelling  . Methotrexate Derivatives     Level of Care/Admitting Diagnosis ED Disposition    ED Disposition Condition [facility] Area: [place] [[id number]]  Level of Care: Stepdown [14]  Admit to SDU based on following criteria: Respiratory Distress:  Frequent assessment and/or intervention to maintain adequate ventilation/respiration, pulmonary toilet, and respiratory treatment.  Covid Evaluation: Confirmed COVID Positive  Diagnosis: Acute respiratory disease due to COVID-19 virus [[id number]]  Admitting Physician: Barb Merino [[id number]]  Attending Physician: Barb Merino [[id number]]  Estimated length of stay: past midnight tomorrow  Certification:: I certify this patient will need inpatient services for at least 2 midnights  PT Class (Do Not Modify): Inpatient [101]  PT Acc Code (Do Not Modify): Private [1]       B Medical/Surgery History Past Medical History:  Diagnosis Date  . Arthritis    rheumatoid  . CHF (congestive heart failure) ([place])   . Chronic obstructive pulmonary disease (COPD) ([place])   . Dependence on continuous supplemental oxygen    4 lpm  . Diabetes mellitus without complication (HCC)   . Hyperlipemia   . Hypertension   . Long term current use of systemic steroids   . MDD (major depressive  disorder)   . OSA (obstructive sleep apnea)   . Osteoporosis   . Panlobular emphysema ([place])   . Seizures ([place])   . Wedge compression fracture of unspecified lumbar vertebra, sequela    History reviewed. No pertinent surgical history.   A IV Location/Drains/Wounds Patient Lines/Drains/Airways Status   Active Line/Drains/Airways    Name:   Placement date:   Placement time:   Site:   Days:   Implanted Port 10/21/18 Right Chest   10/21/18    0902    Chest   less than 1          Intake/Output Last 24 hours No intake or output data in the 24 hours ending 10/21/18 1623  Labs/Imaging Results for orders placed or performed during the hospital encounter of 10/21/18 (from the past 48 hour(s))  Culture, blood (routine x 2)     Status: None (Preliminary result)   Collection Time: 10/21/18  8:55 AM   Specimen: BLOOD  Result Value Ref Range   Specimen Description      BLOOD PORTA CATH Performed at [facility] [address], [place], [place] [address]    Special Requests      BOTTLES DRAWN AEROBIC AND ANAEROBIC Blood Culture adequate volume Performed at [facility] [address], [place], [place] [address]    Culture      NO GROWTH <12 HOURS Performed at [facility] [address], [place], [place] [address]    Report Status PENDING   Comprehensive metabolic panel     Status: Abnormal   Collection Time: 10/21/18  8:56 AM  Result Value Ref Range   Sodium 135 135 - 145 mmol/L   Potassium 2.9 (L) 3.5 - 5.1 mmol/L   Chloride 98 98 - 111 mmol/L   CO2 25 22 - 32 mmol/L   Glucose, Bld 113 (H) 70 - 99 mg/dL   BUN 13 6 - 20 mg/dL   Creatinine, Ser 1.61 0.61 - 1.24 mg/dL   Calcium 7.6 (L) 8.9 - 10.3 mg/dL   Total Protein 5.7 (L) 6.5 - 8.1 g/dL   Albumin 2.5 (L) 3.5 - 5.0 g/dL   AST 31 15 - 41 U/L   ALT 26 0 - 44 U/L   Alkaline Phosphatase 55 38 - 126 U/L   Total Bilirubin 0.6 0.3 - 1.2 mg/dL   GFR calc non Af Amer >60 >60 mL/min    GFR calc Af Amer >60 >60 mL/min   Anion gap 12 5 - 15    Comment: Performed at [facility], [address], [place], [place] [address]  CBC with Differential/Platelet     Status: Abnormal   Collection Time: 10/21/18  8:56 AM  Result Value Ref Range   WBC 3.4 (L) 4.0 - 10.5 K/uL   RBC 7.26 (H) 4.22 - 5.81 MIL/uL   Hemoglobin 20.3 (H) 13.0 - 17.0 g/dL   HCT 54.0 (H) 98.1 - 19.1 %   MCV 88.0 80.0 - 100.0 fL   MCH 28.0 26.0 - 34.0 pg   MCHC 31.8 30.0 - 36.0 g/dL   RDW 47.8 (H) 29.5 - 62.1 %   Platelets 64 (L) 150 - 400 K/uL    Comment: REPEATED TO VERIFY PLATELET COUNT CONFIRMED BY SMEAR SPECIMEN CHECKED FOR CLOTS Immature Platelet Fraction may be clinically indicated, consider ordering this additional test [id number]    nRBC 0.0 0.0 - 0.2 %   Neutrophils Relative % 85 %   Neutro Abs 2.9 1.7 - 7.7 K/uL   Lymphocytes Relative 6 %   Lymphs Abs 0.2 (L) 0.7 - 4.0 K/uL   Monocytes Relative 7 %   Monocytes Absolute 0.2 0.1 - 1.0 K/uL   Eosinophils Relative 0 %   Eosinophils Absolute 0.0 0.0 - 0.5 K/uL   Basophils Relative 1 %   Basophils Absolute 0.0 0.0 - 0.1 K/uL   Immature Granulocytes 1 %   Abs Immature Granulocytes 0.02 0.00 - 0.07 K/uL    Comment: Performed at [facility], [address] [place], [place] [address]  Ferritin     Status: Abnormal   Collection Time: 10/21/18  8:56 AM  Result Value Ref Range   Ferritin 1,447 (H) 24 - 336 ng/mL    Comment: Performed at [facility], [address] [place], [place] [address]  Sedimentation rate     Status: None   Collection Time: 10/21/18  8:56 AM  Result Value Ref Range   Sed Rate 2 0 - 16 mm/hr    Comment: Performed at [facility], [address], [place], [place] [address]  Culture, blood (routine x 2)     Status: None (Preliminary result)   Collection Time: 10/21/18  8:56 AM   Specimen: BLOOD  Result Value Ref Range   Specimen Description       BLOOD PORTA CATH Performed at [facility], [address] [place], [place] [address]    Special Requests      BOTTLES DRAWN AEROBIC AND ANAEROBIC Blood Culture adequate volume Performed at [facility], [address] [place], [place] [address]    Culture      NO GROWTH <12 HOURS Performed at [facility], [address] [place], [place] [address]    Report Status PENDING   Lactic acid, plasma     Status: None   Collection Time: 10/21/18  8:56 AM  Result Value Ref Range   Lactic Acid, Venous 1.1 0.5 - 1.9 mmol/L    Comment: Performed at [facility], [address], [place], [place] [address]  Lactic acid, plasma     Status: None   Collection Time: 10/21/18 10:00 AM  Result Value Ref Range   Lactic Acid, Venous 0.9 0.5 - 1.9 mmol/L    Comment: Performed at [facility], [address] [place], [place] [address]  Procalcitonin     Status: None   Collection Time: 10/21/18 10:00 AM  Result Value Ref Range   Procalcitonin 0.25 ng/mL    Comment:        Interpretation: PCT (Procalcitonin) <= 0.5 ng/mL: Systemic infection (sepsis) is not likely. Local bacterial infection is possible. (NOTE)       Sepsis PCT Algorithm           Lower Respiratory Tract                                      Infection PCT Algorithm    ----------------------------     ----------------------------         PCT < 0.25 ng/mL                PCT < 0.10 ng/mL         Strongly encourage             Strongly discourage   discontinuation of antibiotics    initiation of antibiotics    ----------------------------     -----------------------------       PCT 0.25 - 0.50 ng/mL            PCT 0.10 - 0.25 ng/mL               OR       >80% decrease in PCT            Discourage initiation of                                            antibiotics      Encourage discontinuation           of antibiotics    ----------------------------      -----------------------------         PCT >= 0.50 ng/mL              PCT 0.26 - 0.50 ng/mL               AND        <80% decrease in PCT             Encourage initiation of                                             antibiotics       Encourage continuation  of antibiotics    ----------------------------     -----------------------------        PCT >= 0.50 ng/mL                  PCT > 0.50 ng/mL               AND         increase in PCT                  Strongly encourage                                      initiation of antibiotics    Strongly encourage escalation           of antibiotics                                     -----------------------------                                           PCT <= 0.25 ng/mL                                                 OR                                        > 80% decrease in PCT                                     Discontinue / Do not initiate                                             antibiotics Performed at [facility], [address] [place], [place] [address]   D-dimer, quantitative (not at [facility])     Status: Abnormal   Collection Time: 10/21/18 10:00 AM  Result Value Ref Range   D-Dimer, Quant 2.20 (H) 0.00 - 0.50 ug/mL-FEU    Comment: SPECIMEN COLLECTED IN ANTICOAGULANT ADJUSTED TUBE DUE TO ELEVATED HEMATOCRIT (NOTE) At the manufacturer cut-off of 0.50 ug/mL FEU, this assay has been documented to exclude PE with a sensitivity and negative predictive value of 97 to 99%.  At this time, this assay has not been approved by the FDA to exclude DVT/VTE. Results should be correlated with clinical presentation. Performed at [facility], [address] [place], [place] [address]    Portable Chest 1 View  Result Date: 10/21/2018 CLINICAL DATA:  Shortness of breath. COVID 19 pneumonia. EXAM: PORTABLE CHEST 1 VIEW COMPARISON:  None. FINDINGS: Injectable port terminates at the  cavoatrial junction Cardiomediastinal silhouette is enlarged. Calcific atherosclerotic disease of aorta. Left lower lobe atelectasis versus airspace consolidation. Healing or healed right-sided rib fractures. Soft tissues are grossly normal. IMPRESSION: 1.  Left lower lobe atelectasis versus airspace consolidation. 2. Enlarged cardiac silhouette. Calcific atherosclerotic disease of aorta. Electronically Signed   By: Ted Mcalpine M.D.   On: 10/21/2018 08:15    Pending Labs Unresulted Labs (From admission, onward)    Start     Ordered   10/22/18 0500  CBC with Differential/Platelet  Daily,   R     10/21/18 1428   10/22/18 0500  Comprehensive metabolic panel  Daily,   R     10/21/18 1428   10/22/18 0500  C-reactive protein  Daily,   R     10/21/18 1428   10/22/18 0500  CK  Daily,   R     10/21/18 1428   10/22/18 0500  D-dimer, quantitative (not at [facility])  Daily,   R     10/21/18 1428   10/22/18 0500  Ferritin  Daily,   R     10/21/18 1428   10/22/18 0500  Magnesium  Daily,   R     10/21/18 1428   10/22/18 0500  Phosphorus  Daily,   R     10/21/18 1428   10/21/18 1539  C-reactive protein  Add-on,   AD     10/21/18 1538   10/21/18 1429  ABO/Rh  Once,   STAT     10/21/18 1428   10/21/18 1429  Culture, sputum-assessment  Once,   R     10/21/18 1428   10/21/18 1429  Legionella Pneumophila Serogp 1 Ur Ag  Once,   STAT     10/21/18 1428   10/21/18 1429  Strep pneumoniae urinary antigen  Once,   STAT     10/21/18 1428   10/21/18 0712  HIV antibody (Routine Testing)  Once,   STAT     10/21/18 0715          Vitals/Pain Today's Vitals   10/21/18 1415 10/21/18 1430 10/21/18 1445 10/21/18 1530  BP:  (!) 148/84  (!) 144/80  Pulse: 98 97 96 96  Resp: 18 18 17 20   Temp:      TempSrc:      SpO2: 94% 96% 95% 98%  Weight:      Height:      PainSc:        Isolation Precautions Airborne and Contact precautions  Medications Medications  guaiFENesin-dextromethorphan (ROBITUSSIN  DM) 100-10 MG/5ML syrup 10 mL (has no administration in time range)  chlorpheniramine-HYDROcodone (TUSSIONEX) 10-8 MG/5ML suspension 5 mL (has no administration in time range)  0.9 %  sodium chloride infusion ( Intravenous Transfusing/Transfer 10/21/18 1622)  potassium chloride SA (K-DUR) CR tablet 40 mEq (40 mEq Oral Given 10/21/18 1541)  aspirin tablet 325 mg (325 mg Oral Given 10/21/18 1620)  morphine (MS CONTIN) 12 hr tablet 60 mg (60 mg Oral Given 10/21/18 1620)  morphine (MSIR) tablet 30 mg (has no administration in time range)  atorvastatin (LIPITOR) tablet 40 mg (has no administration in time range)  carvedilol (COREG) tablet 6.25 mg (has no administration in time range)  diazepam (VALIUM) tablet 5 mg (has no administration in time range)  sertraline (ZOLOFT) tablet 100 mg (has no administration in time range)  venlafaxine (EFFEXOR) tablet 75 mg (has no administration in time range)  insulin glargine (LANTUS) injection 10 Units (has no administration in time range)  lactulose (CHRONULAC) 10 GM/15ML solution 20 g (has no administration in time range)  polyethylene glycol (MIRALAX / GLYCOLAX) packet 17 g (has no administration in time range)  baclofen (LIORESAL) tablet  5 mg (has no administration in time range)  gabapentin (NEURONTIN) tablet 1,600 mg (has no administration in time range)  gabapentin (NEURONTIN) tablet 800 mg (has no administration in time range)  levETIRAcetam (KEPPRA) tablet 1,000 mg (1,000 mg Oral Given 10/21/18 1620)  tiZANidine (ZANAFLEX) tablet 4 mg (has no administration in time range)  albuterol (VENTOLIN HFA) 108 (90 Base) MCG/ACT inhaler 2 puff (has no administration in time range)  diphenhydrAMINE (BENADRYL) tablet 25 mg (has no administration in time range)  polyvinyl alcohol (LIQUIFILM TEARS) 1.4 % ophthalmic solution 2 drop (has no administration in time range)  insulin aspart (novoLOG) injection 0-9 Units (has no administration in time range)  insulin aspart  (novoLOG) injection 0-5 Units (has no administration in time range)  enoxaparin (LOVENOX) injection 40 mg (has no administration in time range)  sodium chloride flush (NS) 0.9 % injection 3 mL (has no administration in time range)  sodium chloride flush (NS) 0.9 % injection 3 mL (has no administration in time range)  0.9 %  sodium chloride infusion (has no administration in time range)  ondansetron (ZOFRAN) tablet 4 mg (has no administration in time range)    Or  ondansetron (ZOFRAN) injection 4 mg (has no administration in time range)  cefTRIAXone (ROCEPHIN) 2 g in sodium chloride 0.9 % 100 mL IVPB (has no administration in time range)  azithromycin (ZITHROMAX) 500 mg in sodium chloride 0.9 % 250 mL IVPB (0 mg Intravenous Stopped 10/21/18 1559)  dexamethasone (DECADRON) injection 10 mg (has no administration in time range)  remdesivir 200 mg in sodium chloride 0.9 % 250 mL IVPB (has no administration in time range)    Followed by  remdesivir 100 mg in sodium chloride 0.9 % 250 mL IVPB (has no administration in time range)  dexamethasone (DECADRON) injection 10 mg (10 mg Intravenous Given 10/21/18 0905)  morphine 4 MG/ML injection 4 mg (4 mg Intravenous Given 10/21/18 1000)  ondansetron (ZOFRAN) injection 4 mg (4 mg Intravenous Given 10/21/18 1000)    Mobility non-ambulatory Moderate fall risk   Focused Assessments Pulmonary Assessment Handoff:  Lung sounds: Bilateral Breath Sounds: Diminished O2 Device: Nasal Cannula O2 Flow Rate (L/min): 10 L/min      R Recommendations: See Admitting Provider Note  Report given to:   Additional Notes:

## 2018-10-21 NOTE — H&P (Signed)
History and Physical    Luke Phillips OZD:[id number] DOB: [birth year] DOA: 10/21/2018  PCP: System, Provider Not In  Patient coming from: Long-term nursing home  I have personally briefly reviewed patient's old medical records available.   Chief Complaint: Shortness of breath for 2 days  HPI: Luke AlexandersRandall Phillips is a 58 y.o. male with medical history significant of multiple severe comorbidities including debilitating rheumatoid arthritis with bedbound status on long-term prednisone and pain medications, type 2 diabetes on insulin, COPD with chronic hypoxic respiratory failure on 4 L of oxygen at baseline, bedbound status, hypertension, chronic diastolic heart failure and hyperlipidemia presenting from nursing home after diagnosis of COVID-19 since 4 days and shortness of breath since 2 days.  According to the patient, he was at another facility where 1 of the clinical staff was diagnosed with COVID-19, they screened all residents, 11 of them were found to be positive so [facility]  He is usually on 4 L of oxygen and has done very well except one episode of bad pneumonia few months ago where [facility].  Patient states that he was fairly doing well until yesterday when he started becoming more short of breath, he has cough and occasional mucoid sputum, his oxygen saturation will remain less than 90% on 5 L of oxygen, they could not go more than 5 L, he had temperature of 101 so they sent him to emergency room with EMS.  Patient was initially brought on nonrebreather, ultimately titrated down to 10 L high flow oxygen through nasal cannula. Patient has multiple chronic problems including pain on all of his joints.  He denies any nausea vomiting.  Denies any bowel changes.  Urinating normally.  He cannot prone.  He needs complete care. ED Course: Initially on nonrebreather now maintaining more than 90% on 10 L high flow.  Blood pressures  are stable.  He has chronic leukopenia and thrombocytopenia, renal functions are normal.  Potassium is 2.9. Patient was given a dose of dexamethasone 10 mg in the emergency room. Admission requested for treatment. Chest x-ray shows left lower lobe consolidation.  Review of Systems: all systems are reviewed and pertinent positive as per HPI otherwise rest are negative.    Past Medical History:  Diagnosis Date  . Chronic obstructive pulmonary disease (COPD) (HCC)   . Hyperlipemia   . Long term current use of systemic steroids   . Panlobular emphysema (HCC)   . Wedge compression fracture of unspecified lumbar vertebra, sequela     History reviewed. No pertinent surgical history.   reports that he has quit smoking. His smoking use included cigarettes. He has never used smokeless tobacco. He reports previous alcohol use. He reports that he does not use drugs.  Allergies  Allergen Reactions  . Sulfasalazine Itching, Rash and Swelling  . Methotrexate Derivatives     History reviewed. No pertinent family history.   Prior to Admission medications   Medication Sig Start Date End Date Taking? Authorizing Provider  albuterol (VENTOLIN HFA) 108 (90 Base) MCG/ACT inhaler Inhale into the lungs every 4 (four) hours as needed for wheezing or shortness of breath.   Yes [provider]  aspirin 325 MG tablet Take 325 mg by mouth daily.   Yes [provider]  atorvastatin (LIPITOR) 40 MG tablet Take 40 mg by mouth daily.   Yes [provider]  baclofen (LIORESAL) 10 MG tablet Take 5 mg by mouth 3 (  three) times daily.   Yes [provider]  Capsaicin (ASPERCREME PAIN RELIEF PATCH EX) Apply 1 patch topically daily as needed (back pain).   Yes [provider]  carvedilol (COREG) 6.25 MG tablet Take 6.25 mg by mouth 2 (two) times daily with a meal.   Yes [provider]  diazepam (VALIUM) 5 MG tablet Take 5 mg by mouth 2 (two) times daily as needed  for anxiety.   Yes [provider]  diphenhydrAMINE (BENADRYL) 25 MG tablet Take 25 mg by mouth daily as needed for allergies.   Yes [provider]  EQ CAPSAICIN PATCH EX Apply 1 patch topically at bedtime. Right forearm   Yes [provider]  furosemide (LASIX) 20 MG tablet Take 20 mg by mouth daily.   Yes [provider]  gabapentin (NEURONTIN) 800 MG tablet Take 1,600 mg by mouth at bedtime.   Yes [provider]  gabapentin (NEURONTIN) 800 MG tablet Take 800 mg by mouth 2 (two) times daily.   Yes [provider]  guaiFENesin (MUCINEX) 600 MG 12 hr tablet Take 600 mg by mouth 2 (two) times daily.   Yes [provider]  guaifenesin (ROBITUSSIN) 100 MG/5ML syrup Take 200 mg by mouth every 4 (four) hours as needed for cough.   Yes [provider]  insulin glargine (LANTUS) 100 UNIT/ML injection Inject 5 Units into the skin at bedtime.   Yes [provider]  insulin lispro (HUMALOG) 100 UNIT/ML injection Inject 4-12 Units into the skin 4 (four) times daily as needed for high blood sugar (sliding scale). 60-200 0 units 201-250 4 units 251-300 6 units 301-350 8 units 351-400 10 units 401+ 12 units   Yes [provider]  ipratropium-albuterol (DUONEB) 0.5-2.5 (3) MG/3ML SOLN Take 3 mLs by nebulization 3 (three) times daily.   Yes [provider]  lactulose (CHRONULAC) 10 GM/15ML solution Take 20 g by mouth 2 (two) times daily.   Yes [provider]  levETIRAcetam (KEPPRA) 1000 MG tablet Take 1,000 mg by mouth 3 (three) times daily.   Yes [provider]  levofloxacin (LEVAQUIN) 750 MG tablet Take 750 mg by mouth daily. 7 day supply started on 10/18/2018   Yes [provider]  lidocaine (LIDODERM) 5 % Place 1 patch onto the skin daily. Remove & Discard patch within 12 hours or as directed by MD  Apply to mid and lower back   Yes [provider]  morphine (MS CONTIN)  60 MG 12 hr tablet Take 60 mg by mouth every 8 (eight) hours.   Yes [provider]  morphine (MSIR) 30 MG tablet Take 30 mg by mouth as needed for severe pain. Every 2 hours PRN   Yes [provider]  ondansetron (ZOFRAN) 4 MG tablet Take 4 mg by mouth every 6 (six) hours as needed for nausea or vomiting.   Yes [provider]  polyethylene glycol (MIRALAX / GLYCOLAX) 17 g packet Take 17 g by mouth daily.   Yes [provider]  polyvinyl alcohol (LIQUIFILM TEARS) 1.4 % ophthalmic solution Place 2 drops into both eyes as needed for dry eyes.   Yes [provider]  potassium chloride SA (K-DUR) 20 MEQ tablet Take 20 mEq by mouth daily.   Yes [provider]  predniSONE (DELTASONE) 10 MG tablet Take 10 mg by mouth daily with breakfast.   Yes [provider]  sertraline (ZOLOFT) 100 MG tablet Take 100 mg by mouth daily.  Yes [provider]  tiZANidine (ZANAFLEX) 4 MG tablet Take 4 mg by mouth every 8 (eight) hours as needed for muscle spasms.   Yes [provider]  venlafaxine (EFFEXOR) 75 MG tablet Take 75 mg by mouth 3 (three) times daily with meals.   Yes [provider]    Physical Exam: Vitals:   10/21/18 1235 10/21/18 1241 10/21/18 1300 10/21/18 1315  BP:   (!) 150/81   Pulse: 100 (!) 104 99 (!) 108  Resp: (!) 21 20 (!) 21 20  Temp:      TempSrc:      SpO2: 96% (!) 89% 94% 91%  Weight:      Height:        Constitutional: NAD, calm, comfortable Vitals:   10/21/18 1235 10/21/18 1241 10/21/18 1300 10/21/18 1315  BP:   (!) 150/81   Pulse: 100 (!) 104 99 (!) 108  Resp: (!) 21 20 (!) 21 20  Temp:      TempSrc:      SpO2: 96% (!) 89% 94% 91%  Weight:      Height:       Eyes: PERRL, lids and conjunctivae normal ENMT: Mucous membranes are moist. Posterior pharynx clear of any exudate or lesions.Normal dentition.  Neck: normal, supple, no masses, no thyromegaly Respiratory: Bilateral poor air  entry, occasional expiratory wheezes and upper airway conducted sounds.  Normal respiratory effort. No accessory muscle use.  Patient is on 10 L oxygen, he can talk in full sentences. Cardiovascular: Tachycardic, regular rate and rhythm, no murmurs / rubs / gallops. No extremity edema. 2+ pedal pulses. No carotid bruits.  Abdomen: no tenderness, no masses palpated. No hepatosplenomegaly. Bowel sounds positive.  Obese and pendulous. Musculoskeletal: Atrophied both legs, ankle with extension contracture.  Classic muscle tones on lower extremities. Skin: no rashes, lesions, ulcers. No induration Neurologic: CN 2-12 grossly intact. Sensation intact, DTR normal. Strength 5/5 in upper extremity.  3 x 5 on both lower extremity and is chronic. Psychiatric: Normal judgment and insight. Alert and oriented x 3. Normal mood.     Labs on Admission: I have personally reviewed following labs and imaging studies  CBC: Recent Labs  Lab 10/21/18 0856  WBC 3.4*  NEUTROABS 2.9  HGB 20.3*  HCT 63.9*  MCV 88.0  PLT 64*   Basic Metabolic Panel: Recent Labs  Lab 10/21/18 0856  NA 135  K 2.9*  CL 98  CO2 25  GLUCOSE 113*  BUN 13  CREATININE 0.67  CALCIUM 7.6*   GFR: Estimated Creatinine Clearance: 111.7 mL/min (by C-G formula based on SCr of 0.67 mg/dL). Liver Function Tests: Recent Labs  Lab 10/21/18 0856  AST 31  ALT 26  ALKPHOS 55  BILITOT 0.6  PROT 5.7*  ALBUMIN 2.5*   No results for input(s): LIPASE, AMYLASE in the last 168 hours. No results for input(s): AMMONIA in the last 168 hours. Coagulation Profile: No results for input(s): INR, PROTIME in the last 168 hours. Cardiac Enzymes: No results for input(s): CKTOTAL, CKMB, CKMBINDEX, TROPONINI in the last 168 hours. BNP (last 3 results) No results for input(s): PROBNP in the last 8760 hours. HbA1C: No results for input(s): HGBA1C in the last 72 hours. CBG: No results for input(s): GLUCAP in the last 168 hours. Lipid Profile:  No results for input(s): CHOL, HDL, LDLCALC, TRIG, CHOLHDL, LDLDIRECT in the last 72 hours. Thyroid Function Tests: No results for input(s): TSH, T4TOTAL, FREET4, T3FREE, THYROIDAB in the last 72 hours. Anemia  Panel: Recent Labs    10/21/18 0856  FERRITIN 1,447*   Urine analysis: No results found for: COLORURINE, APPEARANCEUR, LABSPEC, PHURINE, GLUCOSEU, HGBUR, BILIRUBINUR, KETONESUR, PROTEINUR, UROBILINOGEN, NITRITE, LEUKOCYTESUR  Radiological Exams on Admission: Portable Chest 1 View  Result Date: 10/21/2018 CLINICAL DATA:  Shortness of breath. COVID 19 pneumonia. EXAM: PORTABLE CHEST 1 VIEW COMPARISON:  None. FINDINGS: Injectable port terminates at the cavoatrial junction Cardiomediastinal silhouette is enlarged. Calcific atherosclerotic disease of aorta. Left lower lobe atelectasis versus airspace consolidation. Healing or healed right-sided rib fractures. Soft tissues are grossly normal. IMPRESSION: 1. Left lower lobe atelectasis versus airspace consolidation. 2. Enlarged cardiac silhouette. Calcific atherosclerotic disease of aorta. Electronically Signed   By: Ted Mcalpine M.D.   On: 10/21/2018 08:15    EKG: Independently reviewed.  Normal sinus rhythm.  Normal QTC.  Tachycardia.  Assessment/Plan Principal Problem:   Pneumonia due to COVID-19 virus Active Problems:   Acute on chronic respiratory failure with hypoxia and hypercapnia (HCC)   Anxiety and depression   Chronic pain syndrome   COPD (chronic obstructive pulmonary disease) (HCC)   Diabetes mellitus type 2 in obese (HCC)   Dyslipidemia   Hypertension   Hypokalemia   OSA (obstructive sleep apnea)   Rheumatoid arthritis involving multiple sites (HCC)     1.  Pneumonia due to COVID-19 virus infection, acute on chronic hypoxic respiratory failure, suspect superadded bacterial pneumonia due to asymmetrical opacity on the left side in an immunocompromised patient. Agree with admission given severity of symptoms.  chest physiotherapy, incentive spirometry, deep breathing exercises, sputum induction, mucolytic's and bronchodilators. Sputum cultures, blood cultures, Legionella and streptococcal antigen. Supplemental oxygen to keep saturations more than 90%. Procalcitonin is normal, however given asymmetrical opacity on the left side will treat as bacterial superadded infection with Rocephin and azithromycin for 5 days.    2.  COVID-19 directed therapy: Dexamethasone 10 mg daily IV.  Remdesivir for 5 days, pharmacy to dose. Patient will need daily labs, inflammatory bio markers, electrolytes.  3.  Rheumatoid arthritis involving multiple sites/chronic debilitating arthritis with bedbound status and chronic pain syndrome: On maintenance prednisone, will stop while he is on dexamethasone. Patient is on multiple medication regimen including long-acting morphine, short-acting morphine, high-dose of gabapentin as well as antidepressants that he will continue.  4.  Hypokalemia: With use of ongoing Lasix.  Hold Lasix tonight.  Will aggressively replace potassium.  Recheck levels tomorrow morning.  5.  Hypertension: Stable.  Continue home medications.  6.  Type 2 diabetes: Fairly controlled on insulin.  May exacerbate with use of IV steroids.  Will keep on sliding scale insulin.  Increasing dose of long-acting insulin from 5 to 10 units at night.  7.  Obstructive sleep apnea: We will not use CPAP due to active COVID-19 infection.  He will use high flow nasal cannula oxygen.  Patient has severe systemic disease.  He will need admission, aggressive treatment, IV antibiotics and IV therapy.  Without admission he has high risk of decompensation and mortality.  Anticipate hospitalization with more than 2 midnights.   DVT prophylaxis: Lovenox subcu Code Status: Full code Family Communication: Wife, Luke Phillips called and updated.  Disposition Plan: Admit to [facility] progressive unit Consults called: None Admission status:  Inpatient   Dorcas Carrow MD [facility] Pager [phone]  If 7PM-7AM, please contact night-coverage [email or web address] Password [email or web address]  10/21/2018, 2:00 PM

## 2018-10-21 NOTE — ED Notes (Signed)
RT called to assess pt for high flow O2 use

## 2018-10-21 NOTE — ED Provider Notes (Signed)
[facility]-EMERGENCY DEPT Provider Note   CSN: [id number] Arrival date & time: 10/21/18  2725    History   Chief Complaint Chief Complaint  Patient presents with   Shortness of Breath    HPI Luke Phillips is a 58 y.o. male.     HPI   He was reportedly diagnosed with Covid-19 infection, several days ago he has been maintained on oral medications, currently.  He has history of severe rheumatoid arthritis.  He lives in a skilled nursing facility and is nonambulatory.  He was transferred here by EMS, with respiratory support using 100% oxygen by facemask.  He complains of shortness of breath.  He is able to give most of the history.  He has had some chills recently.  He denies nausea, vomiting, chest pain, new weakness or dizziness.  There is been no headache or chest pain.  Denies productive cough at this time.  There are no other known modifying factors.   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 07/07/2018   Impaired functional mobility, balance, gait, and endurance 07/07/2018   Panlobular emphysema (HCC) 07/07/2018   Seizure disorder (HCC) 07/07/2018   Physical deconditioning 01/06/2018   Chronic diastolic heart failure (HCC) 08/18/2017   BPH (benign prostatic hyperplasia) 05/13/2017   Subcapsular hemorrhage of spleen 05/13/2017   Pulmonary nodule 05/02/2017   Anxiety and depression 04/20/2017   Hypertension 04/20/2017   Chronic pain syndrome 03/09/2017   Compression fracture of thoracic vertebra (HCC) 03/09/2017    Epigastric pain 02/19/2017   Normocytic anemia 01/06/2017   Complicated urinary tract infection 01/05/2017   COPD (chronic obstructive pulmonary disease) (HCC) 11/16/2016   Hypoxia 11/16/2016   Sacral ulcer (HCC) 11/16/2016   Hydronephrosis 11/15/2016   Calculus of kidney 10/26/2016   Hypokalemia 04/28/2016   Acute delirium 04/19/2016   Acute on chronic respiratory failure with hypoxia and hypercapnia (HCC) 04/19/2016   OSA (obstructive sleep apnea) 04/19/2016   Pneumonia, unspecified organism 04/19/2016   Sepsis (HCC) 04/19/2016   Rheumatoid arthritis involving multiple sites (HCC) 05/04/2012   Non-toxic multinodular goiter 03/06/2012   Tobacco use 03/06/2012   Cervical myelopathy (HCC) 10/22/2011    History reviewed. No pertinent surgical history.      Home Medications    Prior to Admission medications   Medication Sig Start Date End Date Taking? Authorizing Provider  albuterol (VENTOLIN HFA) 108 (90 Base) MCG/ACT inhaler Inhale into the lungs every 4 (four) hours as needed for wheezing or shortness of breath.   Yes [provider]  aspirin 325 MG tablet Take 325 mg by mouth daily.   Yes [provider]  atorvastatin (LIPITOR) 40 MG tablet Take 40 mg by mouth daily.   Yes [provider]  baclofen (LIORESAL) 10 MG tablet Take 5 mg by mouth 3 (three) times daily.   Yes [provider]  Capsaicin (ASPERCREME PAIN RELIEF PATCH EX) Apply 1 patch topically daily as needed (back pain).   Yes [provider]  carvedilol (COREG) 6.25 MG tablet Take 6.25 mg by mouth 2 (two) times  daily with a meal.   Yes [provider]  diazepam (VALIUM) 5 MG tablet Take 5 mg by mouth 2 (two) times daily as needed for anxiety.   Yes [provider]  diphenhydrAMINE (BENADRYL) 25 MG tablet Take 25 mg by mouth daily as needed for allergies.   Yes [provider]  EQ CAPSAICIN PATCH EX Apply 1 patch topically at  bedtime. Right forearm   Yes [provider]  furosemide (LASIX) 20 MG tablet Take 20 mg by mouth daily.   Yes [provider]  gabapentin (NEURONTIN) 800 MG tablet Take 1,600 mg by mouth at bedtime.   Yes [provider]  gabapentin (NEURONTIN) 800 MG tablet Take 800 mg by mouth 2 (two) times daily.   Yes [provider]  guaiFENesin (MUCINEX) 600 MG 12 hr tablet Take 600 mg by mouth 2 (two) times daily.   Yes [provider]  guaifenesin (ROBITUSSIN) 100 MG/5ML syrup Take 200 mg by mouth every 4 (four) hours as needed for cough.   Yes [provider]  insulin glargine (LANTUS) 100 UNIT/ML injection Inject 5 Units into the skin at bedtime.   Yes [provider]  insulin lispro (HUMALOG) 100 UNIT/ML injection Inject 4-12 Units into the skin 4 (four) times daily as needed for high blood sugar (sliding scale). 60-200 0 units 201-250 4 units 251-300 6 units 301-350 8 units 351-400 10 units 401+ 12 units   Yes [provider]  ipratropium-albuterol (DUONEB) 0.5-2.5 (3) MG/3ML SOLN Take 3 mLs by nebulization 3 (three) times daily.   Yes [provider]  lactulose (CHRONULAC) 10 GM/15ML solution Take 20 g by mouth 2 (two) times daily.   Yes [provider]  levETIRAcetam (KEPPRA) 1000 MG tablet Take 1,000 mg by mouth 3 (three) times daily.   Yes [provider]  levofloxacin (LEVAQUIN) 750 MG tablet Take 750 mg by mouth daily. 7 day supply started on 10/18/2018   Yes [provider]  lidocaine (LIDODERM) 5 % Place 1 patch onto the skin daily. Remove & Discard patch within 12 hours or as directed by MD  Apply to mid and lower back   Yes [provider]  morphine (MS CONTIN) 60 MG 12 hr tablet Take 60 mg by mouth every 8 (eight) hours.   Yes [provider]  morphine (MSIR) 30 MG tablet Take 30 mg by mouth as needed for severe pain. Every 2 hours PRN   Yes [provider]  ondansetron (ZOFRAN) 4 MG tablet Take 4 mg by mouth every 6 (six) hours as needed for nausea or vomiting.   Yes [provider]  polyethylene glycol (MIRALAX / GLYCOLAX) 17 g packet Take 17 g by mouth daily.   Yes [provider]  polyvinyl alcohol (LIQUIFILM TEARS) 1.4 % ophthalmic solution Place 2 drops into both eyes as needed for dry eyes.   Yes [provider]  potassium chloride SA (K-DUR) 20 MEQ tablet Take 20 mEq by mouth daily.   Yes [provider]  predniSONE (DELTASONE) 10 MG tablet Take 10 mg by mouth daily with breakfast.   Yes [provider]  sertraline (ZOLOFT) 100 MG tablet Take 100 mg by mouth daily.   Yes [provider]  tiZANidine (ZANAFLEX) 4 MG tablet Take 4 mg by mouth every 8 (eight) hours as needed for muscle spasms.   Yes [provider]  venlafaxine (EFFEXOR) 75 MG tablet Take 75 mg by mouth 3 (three)  times daily with meals.   Yes [provider]    Family History No family history on file.  Social History Social History   Tobacco Use   Smoking status: Former Smoker    Types: Cigarettes   Smokeless tobacco: Never Used  Substance Use Topics   Alcohol use: Not Currently   Drug use: Never     Allergies   Sulfasalazine and Methotrexate derivatives   Review of Systems Review of Systems  All other systems reviewed and are negative.    Physical Exam Updated Vital Signs BP (!) 149/86    Pulse 100    Temp 99.2 F (37.3 C) (Oral)    Resp (!) 22    Ht 6\' 2"  (1.88 m)    Wt 79.4 kg    SpO2 96%    BMI 22.47 kg/m   Physical Exam Vitals signs and nursing note reviewed.  Constitutional:      General: He is not in acute distress.    Appearance: He is well-developed. He is not ill-appearing, toxic-appearing or diaphoretic.  HENT:     Head: Normocephalic and atraumatic.     Right Ear: External ear normal.     Left Ear: External ear normal.  Eyes:     Conjunctiva/sclera:  Conjunctivae normal.     Pupils: Pupils are equal, round, and reactive to light.  Neck:     Musculoskeletal: Normal range of motion and neck supple.     Trachea: Phonation normal.  Cardiovascular:     Rate and Rhythm: Normal rate and regular rhythm.     Heart sounds: Normal heart sounds.  Pulmonary:     Effort: Pulmonary effort is normal.     Breath sounds: Normal breath sounds.  Abdominal:     Palpations: Abdomen is soft.     Tenderness: There is no abdominal tenderness.  Musculoskeletal: Normal range of motion.  Skin:    General: Skin is warm and dry.  Neurological:     Mental Status: He is alert and oriented to person, place, and time.     Cranial Nerves: No cranial nerve deficit.     Sensory: No sensory deficit.     Motor: No abnormal muscle tone.     Coordination: Coordination normal.  Psychiatric:        Behavior: Behavior normal.        Thought Content: Thought content normal.        Judgment: Judgment normal.      ED Treatments / Results  Labs (all labs ordered are listed, but only abnormal results are displayed) Labs Reviewed  COMPREHENSIVE METABOLIC PANEL - Abnormal; Notable for the following components:      Result Value   Potassium 2.9 (*)    Glucose, Bld 113 (*)    Calcium 7.6 (*)    Total Protein 5.7 (*)    Albumin 2.5 (*)    All other components within normal limits  CBC WITH DIFFERENTIAL/PLATELET - Abnormal; Notable for the following components:   WBC 3.4 (*)    RBC 7.26 (*)    Hemoglobin 20.3 (*)    HCT 63.9 (*)    RDW 17.2 (*)    Platelets 64 (*)    Lymphs Abs 0.2 (*)    All other components within normal limits  FERRITIN - Abnormal; Notable for the following components:   Ferritin 1,447 (*)    All other components within normal limits  D-DIMER, QUANTITATIVE (NOT AT [facility]) - Abnormal; Notable for the following components:  D-Dimer, Quant 2.20 (*)    All other components within normal limits  CULTURE, BLOOD (ROUTINE X 2)  CULTURE, BLOOD  (ROUTINE X 2)  SEDIMENTATION RATE  LACTIC ACID, PLASMA  LACTIC ACID, PLASMA  PROCALCITONIN  HIV ANTIBODY (ROUTINE TESTING W REFLEX)    EKG EKG Interpretation  Date/Time:  Friday October 21 2018 08:26:11 EDT Ventricular Rate:  107 PR Interval:    QRS Duration: 89 QT Interval:  302 QTC Calculation: 403 R Axis:   103 Text Interpretation:  Sinus tachycardia Right axis deviation Low voltage, precordial leads Nonspecific T abnrm, anterolateral leads No old tracing to compare Confirmed by Mancel BaleWentz, Yvonda Fouty [phone]([id number]) on 10/21/2018 8:33:20 AM   Radiology Portable Chest 1 View  Result Date: 10/21/2018 CLINICAL DATA:  Shortness of breath. COVID 19 pneumonia. EXAM: PORTABLE CHEST 1 VIEW COMPARISON:  None. FINDINGS: Injectable port terminates at the cavoatrial junction Cardiomediastinal silhouette is enlarged. Calcific atherosclerotic disease of aorta. Left lower lobe atelectasis versus airspace consolidation. Healing or healed right-sided rib fractures. Soft tissues are grossly normal. IMPRESSION: 1. Left lower lobe atelectasis versus airspace consolidation. 2. Enlarged cardiac silhouette. Calcific atherosclerotic disease of aorta. Electronically Signed   By: Ted Mcalpineobrinka  Dimitrova M.D.   On: 10/21/2018 08:15    Procedures .Critical Care Performed by: Mancel BaleWentz, Surina Storts, MD Authorized by: Mancel BaleWentz, Shalini Mair, MD   Critical care provider statement:    Critical care time (minutes):  75   Critical care start time:  10/21/2018 7:00 AM   Critical care end time:  10/21/2018 12:34 PM   Critical care time was exclusive of:  Separately billable procedures and treating other patients   Critical care was necessary to treat or prevent imminent or life-threatening deterioration of the following conditions:  Respiratory failure   Critical care was time spent personally by me on the following activities:  Blood draw for specimens, development of treatment plan with patient or surrogate, discussions with consultants, evaluation  of patient's response to treatment, examination of patient, obtaining history from patient or surrogate, ordering and performing treatments and interventions, ordering and review of laboratory studies, pulse oximetry, re-evaluation of patient's condition, review of old charts and ordering and review of radiographic studies   (including critical care time)  Medications Ordered in ED Medications  guaiFENesin-dextromethorphan (ROBITUSSIN DM) 100-10 MG/5ML syrup 10 mL (has no administration in time range)  chlorpheniramine-HYDROcodone (TUSSIONEX) 10-8 MG/5ML suspension 5 mL (has no administration in time range)  0.9 %  sodium chloride infusion ( Intravenous New Bag/Given 10/21/18 0905)  dexamethasone (DECADRON) injection 10 mg (10 mg Intravenous Given 10/21/18 0905)  morphine 4 MG/ML injection 4 mg (4 mg Intravenous Given 10/21/18 1000)  ondansetron (ZOFRAN) injection 4 mg (4 mg Intravenous Given 10/21/18 1000)     Initial Impression / Assessment and Plan / ED Course  I have reviewed the triage vital signs and the nursing notes.  Pertinent labs & imaging results that were available during my care of the patient were reviewed by me and considered in my medical decision making (see chart for details).  Clinical Course as of Oct 21 1234  Fri Oct 21, 2018  1156 Elevated, ordered for trending, biomarker for COVID infection  D-dimer, quantitative (not at [facility])(!) [EW]  1203 Normal  Procalcitonin [EW]  1204 Normal except white count low, hemoglobin high, platelets low  CBC with Differential/Platelet(!) [EW]  1204 Normal  Lactic acid, plasma [EW]  1204 Elevated, ordered for trending, biomarker for COVID infection  Ferritin(!) [EW]  1206 Normal except potassium low, glucose  high, calcium low, total protein low, albumin low  Comprehensive metabolic panel(!) [EW]  1234 Oxygenation currently 94% on 10 L nasal cannula oxygen.  This is been administered with a high flow device.  Attempting to wean  patient off oxygen is not successful.  He will require hospitalization for treatment of significant hypoxemia in the face of Covid-19 infection.   [EW]    Clinical Course User Index [EW] Mancel Bale, MD        Patient Vitals for the past 24 hrs:  BP Temp Temp src Pulse Resp SpO2 Height Weight  10/21/18 1200 (!) 149/86 -- -- 100 (!) 22 96 % -- --  10/21/18 1130 (!) 152/78 -- -- (!) 101 (!) 21 96 % -- --  10/21/18 1100 (!) 153/80 -- -- (!) 104 (!) 25 97 % -- --  10/21/18 1030 (!) 159/81 -- -- (!) 107 20 97 % -- --  10/21/18 1015 -- -- -- (!) 105 (!) 25 95 % -- --  10/21/18 1000 (!) 146/74 -- -- (!) 110 (!) 25 (!) 84 % -- --  10/21/18 0930 -- -- -- (!) 107 (!) 26 92 % -- --  10/21/18 0900 -- -- -- (!) 113 (!) 22 94 % -- --  10/21/18 0830 (!) 157/77 -- -- (!) 109 (!) 35 (!) 88 % -- --  10/21/18 0800 -- -- -- (!) 107 (!) 29 92 % -- --  10/21/18 0715 -- -- -- (!) 108 (!) 21 95 % -- --  10/21/18 0657 -- -- -- -- -- -- 6\' 2"  (1.88 m) 79.4 kg  10/21/18 0656 (!) 175/87 99.2 F (37.3 C) Oral (!) 109 (!) 30 97 % -- --      Medical Decision Making: Covid-19 infection, subacute, worsening, with hypoxia, in a debilitated patient, requiring oxygen support, with high flow nasal cannula oxygen.  Oxygen saturation decreased to 84% during a maneuver of changing him from facemask oxygen to high flow oxygen.  This was very brief time without oxygen support.  He will require hospitalization, with close monitoring.  He does not currently meet criteria for intubation.  Elevated biomarkers indicating significant infection, chest x-ray is consistent with Covid-19 pneumonia.  Doubt serious bacterial infection, metabolic instability or impending vascular collapse.  Patient care will be complicated by rheumatoid arthritis making respiratory support difficult.  It is unlikely that he will be able to lie in the prone position.  Nicholous Girgenti was evaluated in Emergency Department on 10/21/2018 for the symptoms  described in the history of present illness. He was evaluated in the context of the global COVID-19 pandemic, which necessitated consideration that the patient might be at risk for infection with the SARS-CoV-2 virus that causes COVID-19. Institutional protocols and algorithms that pertain to the evaluation of patients at risk for COVID-19 are in a state of rapid change based on information released by regulatory bodies including the CDC and federal and state organizations. These policies and algorithms were followed during the patient's care in the ED.  CRITICAL CARE-yes Performed by: 10/23/2018  Nursing Notes Reviewed/ Care Coordinated Applicable Imaging Reviewed Interpretation of Laboratory Data incorporated into ED treatment  12:34 PM-Consult complete with hospitalist. Patient case explained and discussed.  He agrees to admit patient for further evaluation and treatment. Call ended at 12:55 PM  Plan: Admit   Final Clinical Impressions(s) / ED Diagnoses   Final diagnoses:  Shortness of breath  COVID-19 virus infection  Hypoxia    ED Discharge Orders  None       Mancel BaleWentz, Bonita Brindisi, MD 10/21/18 [phone]

## 2018-10-22 DIAGNOSIS — I10 Essential (primary) hypertension: Secondary | ICD-10-CM

## 2018-10-22 DIAGNOSIS — M069 Rheumatoid arthritis, unspecified: Secondary | ICD-10-CM

## 2018-10-22 DIAGNOSIS — J9621 Acute and chronic respiratory failure with hypoxia: Secondary | ICD-10-CM

## 2018-10-22 DIAGNOSIS — J9622 Acute and chronic respiratory failure with hypercapnia: Secondary | ICD-10-CM

## 2018-10-22 LAB — COMPREHENSIVE METABOLIC PANEL
ALT: 29 U/L (ref 0–44)
AST: 30 U/L (ref 15–41)
Albumin: 2.7 g/dL — ABNORMAL LOW (ref 3.5–5.0)
Alkaline Phosphatase: 67 U/L (ref 38–126)
Anion gap: 13 (ref 5–15)
BUN: 15 mg/dL (ref 6–20)
CO2: 24 mmol/L (ref 22–32)
Calcium: 8.5 mg/dL — ABNORMAL LOW (ref 8.9–10.3)
Chloride: 103 mmol/L (ref 98–111)
Creatinine, Ser: 0.49 mg/dL — ABNORMAL LOW (ref 0.61–1.24)
GFR calc Af Amer: >60 mL/min (ref >60)
GFR calc non Af Amer: >60 mL/min (ref >60)
Glucose, Bld: 174 mg/dL — ABNORMAL HIGH (ref 70–99)
Potassium: 3.8 mmol/L (ref 3.5–5.1)
Sodium: 140 mmol/L (ref 135–145)
Total Bilirubin: 0.4 mg/dL (ref 0.3–1.2)
Total Protein: 7 g/dL (ref 6.5–8.1)

## 2018-10-22 LAB — C-REACTIVE PROTEIN: CRP: 18 mg/dL — ABNORMAL HIGH (ref <1.0)

## 2018-10-22 LAB — MAGNESIUM: Magnesium: 1.9 mg/dL (ref 1.7–2.4)

## 2018-10-22 LAB — CBC WITH DIFFERENTIAL/PLATELET
Abs Immature Granulocytes: 0.05 10*3/uL (ref 0.00–0.07)
Basophils Absolute: 0 10*3/uL (ref 0.0–0.1)
Basophils Relative: 0 %
Eosinophils Absolute: 0 10*3/uL (ref 0.0–0.5)
Eosinophils Relative: 0 %
HCT: 38.7 % — ABNORMAL LOW (ref 39.0–52.0)
Hemoglobin: 12.1 g/dL — ABNORMAL LOW (ref 13.0–17.0)
Immature Granulocytes: 1 %
Lymphocytes Relative: 6 %
Lymphs Abs: 0.6 10*3/uL — ABNORMAL LOW (ref 0.7–4.0)
MCH: 27.6 pg (ref 26.0–34.0)
MCHC: 31.3 g/dL (ref 30.0–36.0)
MCV: 88.2 fL (ref 80.0–100.0)
Monocytes Absolute: 0.7 10*3/uL (ref 0.1–1.0)
Monocytes Relative: 7 %
Neutro Abs: 8.6 10*3/uL — ABNORMAL HIGH (ref 1.7–7.7)
Neutrophils Relative %: 86 %
Platelets: 215 10*3/uL (ref 150–400)
RBC: 4.39 MIL/uL (ref 4.22–5.81)
RDW: 14.9 % (ref 11.5–15.5)
WBC: 10 10*3/uL (ref 4.0–10.5)
nRBC: 0 % (ref 0.0–0.2)

## 2018-10-22 LAB — GLUCOSE, CAPILLARY
Glucose-Capillary: 174 mg/dL — ABNORMAL HIGH (ref 70–99)
Glucose-Capillary: 144 mg/dL — ABNORMAL HIGH (ref 70–99)
Glucose-Capillary: 203 mg/dL — ABNORMAL HIGH (ref 70–99)
Glucose-Capillary: 194 mg/dL — ABNORMAL HIGH (ref 70–99)
Glucose-Capillary: 176 mg/dL — ABNORMAL HIGH (ref 70–99)

## 2018-10-22 LAB — PHOSPHORUS: Phosphorus: 2.3 mg/dL — ABNORMAL LOW (ref 2.5–4.6)

## 2018-10-22 LAB — FERRITIN: Ferritin: 2223 ng/mL — ABNORMAL HIGH (ref 24–336)

## 2018-10-22 LAB — CK: Total CK: 40 U/L — ABNORMAL LOW (ref 49–397)

## 2018-10-22 LAB — D-DIMER, QUANTITATIVE: D-Dimer, Quant: 1.79 ug/mL-FEU — ABNORMAL HIGH (ref 0.00–0.50)

## 2018-10-22 LAB — HIV ANTIBODY (ROUTINE TESTING W REFLEX): HIV Screen 4th Generation wRfx: NONREACTIVE

## 2018-10-22 MED ORDER — TOCILIZUMAB 400 MG/20ML IV SOLN
8.0000 mg/kg | Freq: Once | INTRAVENOUS | Status: AC
  Administered 2018-10-22: 11:00:00 636 mg via INTRAVENOUS
  Filled 2018-10-22: qty 31.8

## 2018-10-22 MED ORDER — MORPHINE SULFATE 15 MG PO TABS
30.0000 mg | ORAL_TABLET | ORAL | Status: DC | PRN
  Administered 2018-10-22 – 2018-11-01 (×20): 30 mg via ORAL
  Filled 2018-10-22 (×20): qty 2

## 2018-10-22 MED ORDER — TOCILIZUMAB 400 MG/20ML IV SOLN: 800.0000 mg | Freq: Once | INTRAVENOUS | Status: DC

## 2018-10-22 MED ORDER — K PHOS MONO-SOD PHOS DI & MONO 155-852-130 MG PO TABS
250.0000 mg | ORAL_TABLET | Freq: Two times a day (BID) | ORAL | Status: AC
  Administered 2018-10-22 (×2): 250 mg via ORAL
  Filled 2018-10-22 (×2): qty 1

## 2018-10-22 MED ORDER — GABAPENTIN 400 MG PO CAPS
800.0000 mg | ORAL_CAPSULE | Freq: Two times a day (BID) | ORAL | Status: DC
  Administered 2018-10-22 – 2018-11-01 (×21): 800 mg via ORAL
  Filled 2018-10-22: qty 2
  Filled 2018-10-22 (×2): qty 8
  Filled 2018-10-22 (×5): qty 2
  Filled 2018-10-22: qty 8
  Filled 2018-10-22 (×5): qty 2
  Filled 2018-10-22 (×3): qty 8
  Filled 2018-10-22 (×2): qty 2
  Filled 2018-10-22: qty 8
  Filled 2018-10-22 (×3): qty 2
  Filled 2018-10-22: qty 8
  Filled 2018-10-22 (×4): qty 2
  Filled 2018-10-22 (×2): qty 8
  Filled 2018-10-22 (×4): qty 2

## 2018-10-22 NOTE — Progress Notes (Signed)
CSW acknowledges patient from [facility], will continue to follow for discharge planning needs once medically stable.   Solon, Garrett

## 2018-10-22 NOTE — Progress Notes (Signed)
PROGRESS NOTE  Luke Phillips MEB:[id number] DOB: November 06, 1960 DOA: 10/21/2018 PCP: System, Provider Not In  HPI/Recap of past 53 hours: 58 year old male with past medical history of severe rheumatoid arthritis with bedbound status on long-term prednisone and pain medications plus chronic respiratory failure from COPD on 4 L of oxygen plus chronic diastolic heart failure and hypertension who was discovered to have COVID-19 4 days prior after an outbreak at the nursing facility which led to everyone being tested, presented to the emergency room on 7/20 2:04 days of shortness of breath.  Patient brought in after having temperature of 101 as well.  In the emergency room, patient initially required nonrebreather, but was able to keep oxygen saturations at 90% with 10 L high flow oxygen.  Patient given dexamethasone and started on Remdisivir.  Chest x-ray noted left lower lobe consolidation, but he was not given Actemra initially due to lab work noting leukopenia and thrombocytopenia.  Patient transferred to [facility] campus.  This morning, patient states his breathing is a little bit better.  He complains of lots of pain in his back as well as right arm.  He is down to 8 L high flow oxygen.  Follow-up labs this morning note no evidence of leukopenia or thrombocytopenia.  Actemra ordered.  Assessment/Plan: Principal Problem: Acute on chronic respiratory failure with hypoxia and hypercapnia secondary to pneumonia due to COVID-19 virus with underlying COPD: Continue steroids and Remdisivir (5 days total).  Discussed with patient that Actemra use in treating COVID is off label, however he consented.  Because of present of infiltrate and mild elevation procalcitonin, patient received initial antibiotics.  Follow-up procalcitonin and may discontinue this.  Patient on baseline 4 L nasal cannula, currently at 8 L high flow, however this is an improvement.  Patient is on Lovenox 40 mg daily given that he is not  obese, not severely septic and d-dimer while elevated, well below 5 Active Problems:    Anxiety and depression: Continue home medications    Chronic pain syndrome: Continue home medications    Diabetes mellitus type 2 in obese [facility]): Monitor CBGs, especially in the setting of steroids   Dyslipidemia   Hypertension: Blood pressure is better this morning.   Hypokalemia: Replaced   OSA (obstructive sleep apnea): Avoiding nightly CPAP   Rheumatoid arthritis involving multiple sites [facility]): Hopefully with steroids and Actemra, this does improve his symptoms    Code Status: Full code  Family Communication: Updated wife by phone  Disposition Plan: Continue in stepdown.  As oxygenation continues to improve and inflammatory markers come down, can downgrade status.  Home once back to baseline of 4 L, negative COVID test and completed Remdisivir course   Consultants:  None  Procedures:  None  Antimicrobials:  Rocephin and Zithromax 7/24-present  DVT prophylaxis: Lovenox currently at 40 mg daily   Objective: Vitals:   10/22/18 0700 10/22/18 0800  BP: 131/85 139/82  Pulse: 95 (!) 103  Resp: (!) 21 (!) 27  Temp:  98.3 F (36.8 C)  SpO2: 96% 94%    Intake/Output Summary (Last 24 hours) at 10/22/2018 1036 Last data filed at 10/22/2018 0600 Gross per 24 hour  Intake 1180 ml  Output 850 ml  Net 330 ml   Filed Weights   10/21/18 0657  Weight: 79.4 kg   Body mass index is 22.47 kg/m.  Exam:   General: Alert and oriented x3, mild distress secondary to back pain  HEENT: Normocephalic and atraumatic, mucous membranes are slightly dry  Neck: Supple, no JVD  Cardiovascular: Regular rhythm, borderline tachycardia  Respiratory: Decreased breath sounds bibasilar  Abdomen: Soft, nontender, nondistended, hypoactive bowel sounds  Musculoskeletal: No clubbing or cyanosis, trace pitting edema  Skin: No skin breaks, tears or lesions  Psychiatry: Appropriate, no evidence  of psychoses  Neuro: Right arm with significant decreased muscle strength in flexion and extension as compared to left (chronic due to rheumatoid arthritis)    Data Reviewed: CBC: Recent Labs  Lab 10/21/18 0856 10/22/18 0400  WBC 3.4* 10.0  NEUTROABS 2.9 8.6*  HGB 20.3* 12.1*  HCT 63.9* 38.7*  MCV 88.0 88.2  PLT 64* 215   Basic Metabolic Panel: Recent Labs  Lab 10/21/18 0856 10/22/18 0400  NA 135 140  K 2.9* 3.8  CL 98 103  CO2 25 24  GLUCOSE 113* 174*  BUN 13 15  CREATININE 0.67 0.49*  CALCIUM 7.6* 8.5*  MG  --  1.9  PHOS  --  2.3*   GFR: Estimated Creatinine Clearance: 111.7 mL/min (A) (by C-G formula based on SCr of 0.49 mg/dL (L)). Liver Function Tests: Recent Labs  Lab 10/21/18 0856 10/22/18 0400  AST 31 30  ALT 26 29  ALKPHOS 55 67  BILITOT 0.6 0.4  PROT 5.7* 7.0  ALBUMIN 2.5* 2.7*   No results for input(s): LIPASE, AMYLASE in the last 168 hours. No results for input(s): AMMONIA in the last 168 hours. Coagulation Profile: No results for input(s): INR, PROTIME in the last 168 hours. Cardiac Enzymes: Recent Labs  Lab 10/22/18 0400  CKTOTAL 40*   BNP (last 3 results) No results for input(s): PROBNP in the last 8760 hours. HbA1C: No results for input(s): HGBA1C in the last 72 hours. CBG: Recent Labs  Lab 10/21/18 2126 10/22/18 0246 10/22/18 0758  GLUCAP 239* 174* 144*   Lipid Profile: No results for input(s): CHOL, HDL, LDLCALC, TRIG, CHOLHDL, LDLDIRECT in the last 72 hours. Thyroid Function Tests: No results for input(s): TSH, T4TOTAL, FREET4, T3FREE, THYROIDAB in the last 72 hours. Anemia Panel: Recent Labs    10/21/18 0856 10/22/18 0400  FERRITIN 1,447* 2,223*   Urine analysis: No results found for: COLORURINE, APPEARANCEUR, LABSPEC, PHURINE, GLUCOSEU, HGBUR, BILIRUBINUR, KETONESUR, PROTEINUR, UROBILINOGEN, NITRITE, LEUKOCYTESUR Sepsis Labs: @LABRCNTIP (procalcitonin:4,lacticidven:4)  ) Recent Results (from the past 240  hour(s))  Culture, blood (routine x 2)     Status: None (Preliminary result)   Collection Time: 10/21/18  8:55 AM   Specimen: BLOOD  Result Value Ref Range Status   Specimen Description   Final    BLOOD PORTA CATH Performed at [facility], [address] [place], [place] [address]    Special Requests   Final    BOTTLES DRAWN AEROBIC AND ANAEROBIC Blood Culture adequate volume Performed at [facility], [address] [place], [place] [address]    Culture   Final    NO GROWTH < 24 HOURS Performed at [facility], [address] [place], [place] [address]    Report Status PENDING  Incomplete  Culture, blood (routine x 2)     Status: None (Preliminary result)   Collection Time: 10/21/18  8:56 AM   Specimen: BLOOD  Result Value Ref Range Status   Specimen Description   Final    BLOOD PORTA CATH Performed at [facility], [address] [place], [place] [address]    Special Requests   Final    BOTTLES DRAWN AEROBIC AND ANAEROBIC Blood Culture adequate volume Performed  at [facility], [address] [place], [place] [address]    Culture   Final    NO GROWTH < 24 HOURS Performed at [facility], [address] [place], [place] [address]    Report Status PENDING  Incomplete  SARS Coronavirus 2 (CEPHEID - Performed in [facility] hospital lab), Hosp Order     Status: Abnormal   Collection Time: 10/21/18  4:54 PM   Specimen: Nasopharyngeal Swab  Result Value Ref Range Status   SARS Coronavirus 2 POSITIVE (A) NEGATIVE Final    Comment: RESULT CALLED TO, READ BACK BY AND VERIFIED WITH: FRICKEY,J. RN @1920  ON 07.24.2020 BY COHEN,K (NOTE) If result is NEGATIVE SARS-CoV-2 target nucleic acids are NOT DETECTED. The SARS-CoV-2 RNA is generally detectable in upper and lower  respiratory specimens during the acute phase of infection. The lowest  concentration of SARS-CoV-2 viral copies this  assay can detect is 250  copies / mL. A negative result does not preclude SARS-CoV-2 infection  and should not be used as the sole basis for treatment or other  patient management decisions.  A negative result may occur with  improper specimen collection / handling, submission of specimen other  than nasopharyngeal swab, presence of viral mutation(s) within the  areas targeted by this assay, and inadequate number of viral copies  (<250 copies / mL). A negative result must be combined with clinical  observations, patient history, and epidemiological information. If result is POSITIVE SARS-CoV-2 target nucleic acids are DET ECTED. The SARS-CoV-2 RNA is generally detectable in upper and lower  respiratory specimens during the acute phase of infection.  Positive  results are indicative of active infection with SARS-CoV-2.  Clinical  correlation with patient history and other diagnostic information is  necessary to determine patient infection status.  Positive results do  not rule out bacterial infection or co-infection with other viruses. If result is PRESUMPTIVE POSTIVE SARS-CoV-2 nucleic acids MAY BE PRESENT.   A presumptive positive result was obtained on the submitted specimen  and confirmed on repeat testing.  While 2019 novel coronavirus  (SARS-CoV-2) nucleic acids may be present in the submitted sample  additional confirmatory testing may be necessary for epidemiological  and / or clinical management purposes  to differentiate between  SARS-CoV-2 and other Sarbecovirus currently known to infect humans.  If clinically indicated additional testing with an alternate test  methodology (LA [id number]) is advised. The SARS-CoV-2 RNA is generally  detectable in upper and lower respiratory specimens during the acute  phase of infection. The expected result is Negative. Fact Sheet for Patients:  BoilerBrush.com.cy Fact Sheet for Healthcare  Providers: https://pope.com/ This test is not yet approved or cleared by the [place] FDA and has been authorized for detection and/or diagnosis of SARS-CoV-2 by FDA under an Emergency Use Authorization (EUA).  This EUA will remain in effect (meaning this test can be used) for the duration of the COVID-19 declaration under Section 564(b)(1) of the Act, 21 U.S.C. section 360bbb-3(b)(1), unless the authorization is terminated or revoked sooner. Performed at [facility], [address] [place], [place] [address]       Studies: No results found.  Scheduled Meds:  aspirin  325 mg Oral Daily   atorvastatin  40 mg Oral Daily   baclofen  5 mg Oral TID   carvedilol  6.25 mg Oral BID WC   dexamethasone (DECADRON) injection  10 mg Intravenous Q24H   enoxaparin (LOVENOX) injection  40 mg  Subcutaneous Q24H   gabapentin  1,600 mg Oral QHS   gabapentin  800 mg Oral BID   insulin aspart  0-5 Units Subcutaneous QHS   insulin aspart  0-9 Units Subcutaneous TID WC   insulin glargine  10 Units Subcutaneous QHS   lactulose  20 g Oral BID   levETIRAcetam  1,000 mg Oral TID   morphine  60 mg Oral Q8H   polyethylene glycol  17 g Oral Daily   potassium chloride  40 mEq Oral BID   sertraline  100 mg Oral Daily   sodium chloride flush  3 mL Intravenous Q12H   venlafaxine  75 mg Oral TID WC    Continuous Infusions:  sodium chloride 75 mL/hr at 10/22/18 0600   sodium chloride     azithromycin Stopped (10/21/18 1559)   cefTRIAXone (ROCEPHIN)  IV Stopped (10/21/18 1645)   remdesivir 100 mg in NS 250 mL     tocilizumab (ACTEMRA) IV       LOS: 1 day     Annita Brod, MD [facility]  To reach me or the doctor on call, go to: [email or web address] Password [facility]  10/22/2018, 10:36 AM

## 2018-10-22 NOTE — [place] FL2 (Signed)
[place] MEDICAID FL2 LEVEL OF CARE SCREENING TOOL     IDENTIFICATION  Patient Name: Luke Phillips Birthdate: October 05, 1960 Sex: male Admission Date (Current Location): 10/21/2018  [facility] and [place] Number:  Producer, television/film/video and Address:  [facility], [address] [place], [place] [address]([facility])      Provider Number: [id number]  Attending Physician Name and Address:  Hollice Espy, MD  Relative Name and Phone Number:  Daphine (spouse)[phone]    Current Level of Care: Hospital Recommended Level of Care: Skilled Nursing Facility Prior Approval Number:    Date Approved/Denied:   PASRR Number: [id number]  Discharge Plan: SNF    Current Diagnoses: Patient Active Problem List   Diagnosis Date Noted  . Pneumonia due to COVID-19 virus 10/21/2018  . Acute respiratory disease due to COVID-19 virus 10/21/2018  . Dysphasia 07/08/2018  . Diabetes mellitus type 2 in obese (HCC) 07/07/2018  . Dyslipidemia 07/07/2018  . 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 Impaired functional mobility, balance, gait, and endurance 07/07/2018  . Panlobular emphysema (HCC) 07/07/2018  . Seizure disorder (HCC) 07/07/2018  . Physical deconditioning 01/06/2018  . Chronic diastolic heart failure (HCC) 08/18/2017  . BPH (benign prostatic hyperplasia) 05/13/2017  . Subcapsular hemorrhage of spleen 05/13/2017  . Pulmonary nodule 05/02/2017  . Anxiety and depression 04/20/2017  . Hypertension 04/20/2017  . Chronic pain syndrome 03/09/2017  . Compression fracture of thoracic vertebra (HCC) 03/09/2017  . Epigastric pain 02/19/2017  . Normocytic anemia 01/06/2017  . Complicated urinary tract infection 01/05/2017  . COPD (chronic obstructive pulmonary disease) (HCC) 11/16/2016  . Hypoxia 11/16/2016  . Sacral ulcer (HCC) 11/16/2016  . Hydronephrosis 11/15/2016  . Calculus of kidney 10/26/2016  . Hypokalemia 04/28/2016  . Acute  delirium 04/19/2016  . Acute on chronic respiratory failure with hypoxia and hypercapnia (HCC) 04/19/2016  . OSA (obstructive sleep apnea) 04/19/2016  . Pneumonia, unspecified organism 04/19/2016  . Sepsis (HCC) 04/19/2016  . Rheumatoid arthritis involving multiple sites (HCC) 05/04/2012  . Non-toxic multinodular goiter 03/06/2012  . Tobacco use 03/06/2012  . Cervical myelopathy (HCC) 10/22/2011    Orientation RESPIRATION BLADDER Height & Weight     Self, Situation, Place  O2(15 L/min nasal cannula (at 4L at baseline)) External catheter Weight: 175 lb (79.4 kg) Height:  6\' 2"  (188 cm)  BEHAVIORAL SYMPTOMS/MOOD NEUROLOGICAL BOWEL NUTRITION STATUS      Continent Diet(see discharge summary)  AMBULATORY STATUS COMMUNICATION OF NEEDS Skin   Total Care Verbally Other (Comment)(MASD Buttocks)                       Personal Care Assistance Level of Assistance  Bathing, Feeding, Dressing, Total care Bathing Assistance: Maximum assistance Feeding assistance: Limited assistance Dressing Assistance: Maximum assistance Total Care Assistance: Maximum assistance   Functional Limitations Info  Sight, Hearing, Speech Sight Info: Adequate Hearing Info: Adequate Speech Info: Adequate    SPECIAL CARE FACTORS FREQUENCY  PT (By licensed PT), OT (By licensed OT)     PT Frequency: min 5x weekly OT Frequency: min 5x weekly            Contractures Contractures Info: Not present    Additional Factors Info  Code Status, Allergies, Isolation Precautions Code Status Info: full Allergies Info: sulfasalazine, methotrexate derivatives     Isolation Precautions Info: air and contact precautions, emerging pathogen     Current Medications (10/22/2018):  This is the current hospital active medication list Current  Facility-Administered Medications  Medication Dose Route Frequency Provider Last Rate Last Dose  . 0.9 %  sodium chloride infusion   Intravenous Continuous Dorcas CarrowGhimire, Kuber, MD 75  mL/hr at 10/22/18 0600    . 0.9 %  sodium chloride infusion  250 mL Intravenous PRN Dorcas CarrowGhimire, Kuber, MD      . albuterol (VENTOLIN HFA) 108 (90 Base) MCG/ACT inhaler 2 puff  2 puff Inhalation Q4H PRN Dorcas CarrowGhimire, Kuber, MD      . aspirin tablet 325 mg  325 mg Oral Daily Dorcas CarrowGhimire, Kuber, MD   325 mg at 10/22/18 1020  . atorvastatin (LIPITOR) tablet 40 mg  40 mg Oral Daily Dorcas CarrowGhimire, Kuber, MD   40 mg at 10/22/18 1020  . azithromycin (ZITHROMAX) 500 mg in sodium chloride 0.9 % 250 mL IVPB  500 mg Intravenous Q24H Dorcas CarrowGhimire, Kuber, MD   Stopped at 10/21/18 1559  . baclofen (LIORESAL) tablet 5 mg  5 mg Oral TID Dorcas CarrowGhimire, Kuber, MD   5 mg at 10/22/18 1020  . carvedilol (COREG) tablet 6.25 mg  6.25 mg Oral BID WC Dorcas CarrowGhimire, Kuber, MD   6.25 mg at 10/22/18 0834  . cefTRIAXone (ROCEPHIN) 2 g in sodium chloride 0.9 % 100 mL IVPB  2 g Intravenous Q24H Dorcas CarrowGhimire, Kuber, MD   Stopped at 10/21/18 1645  . chlorpheniramine-HYDROcodone (TUSSIONEX) 10-8 MG/5ML suspension 5 mL  5 mL Oral Q12H PRN Dorcas CarrowGhimire, Kuber, MD      . dexamethasone (DECADRON) injection 10 mg  10 mg Intravenous Q24H Dorcas CarrowGhimire, Kuber, MD   10 mg at 10/22/18 1021  . diazepam (VALIUM) tablet 5 mg  5 mg Oral BID PRN Dorcas CarrowGhimire, Kuber, MD      . diphenhydrAMINE (BENADRYL) capsule 25 mg  25 mg Oral Daily PRN Dorcas CarrowGhimire, Kuber, MD      . enoxaparin (LOVENOX) injection 40 mg  40 mg Subcutaneous Q24H Ghimire, Lyndel SafeKuber, MD      . gabapentin (NEURONTIN) capsule 1,600 mg  1,600 mg Oral QHS Ghimire, Lyndel SafeKuber, MD      . gabapentin (NEURONTIN) capsule 800 mg  800 mg Oral BID Dorcas CarrowGhimire, Kuber, MD   800 mg at 10/22/18 0834  . guaiFENesin-dextromethorphan (ROBITUSSIN DM) 100-10 MG/5ML syrup 10 mL  10 mL Oral Q4H PRN Dorcas CarrowGhimire, Kuber, MD      . insulin aspart (novoLOG) injection 0-5 Units  0-5 Units Subcutaneous QHS Ghimire, Kuber, MD      . insulin aspart (novoLOG) injection 0-9 Units  0-9 Units Subcutaneous TID WC Dorcas CarrowGhimire, Kuber, MD   1 Units at 10/22/18 0835  . insulin glargine (LANTUS)  injection 10 Units  10 Units Subcutaneous QHS Dorcas CarrowGhimire, Kuber, MD   10 Units at 10/22/18 0401  . lactulose (CHRONULAC) 10 GM/15ML solution 20 g  20 g Oral BID Dorcas CarrowGhimire, Kuber, MD   20 g at 10/22/18 1020  . levETIRAcetam (KEPPRA) tablet 1,000 mg  1,000 mg Oral TID Dorcas CarrowGhimire, Kuber, MD   1,000 mg at 10/22/18 1020  . morphine (MS CONTIN) 12 hr tablet 60 mg  60 mg Oral Q8H Dorcas CarrowGhimire, Kuber, MD   60 mg at 10/22/18 0521  . morphine (MSIR) tablet 30 mg  30 mg Oral Q2H PRN Dorcas CarrowGhimire, Kuber, MD   30 mg at 10/22/18 1020  . ondansetron (ZOFRAN) tablet 4 mg  4 mg Oral Q6H PRN Dorcas CarrowGhimire, Kuber, MD       Or  . ondansetron (ZOFRAN) injection 4 mg  4 mg Intravenous Q6H PRN Dorcas CarrowGhimire, Kuber, MD   4 mg at 10/22/18 0601  .  polyethylene glycol (MIRALAX / GLYCOLAX) packet 17 g  17 g Oral Daily Barb Merino, MD   Stopped at 10/21/18 2058  . polyvinyl alcohol (LIQUIFILM TEARS) 1.4 % ophthalmic solution 2 drop  2 drop Both Eyes PRN Barb Merino, MD      . potassium chloride SA (K-DUR) CR tablet 40 mEq  40 mEq Oral BID Barb Merino, MD   40 mEq at 10/22/18 1020  . remdesivir 100 mg in sodium chloride 0.9 % 250 mL IVPB  100 mg Intravenous Q24H Barb Merino, MD      . sertraline (ZOLOFT) tablet 100 mg  100 mg Oral Daily Barb Merino, MD   100 mg at 10/22/18 1035  . sodium chloride flush (NS) 0.9 % injection 3 mL  3 mL Intravenous Q12H Barb Merino, MD   3 mL at 10/22/18 1022  . sodium chloride flush (NS) 0.9 % injection 3 mL  3 mL Intravenous PRN Barb Merino, MD      . tiZANidine (ZANAFLEX) tablet 4 mg  4 mg Oral Q8H PRN Barb Merino, MD      . venlafaxine [facility]) tablet 75 mg  75 mg Oral TID WC Barb Merino, MD   75 mg at 10/22/18 9826     Discharge Medications: Please see discharge summary for a list of discharge medications.  Relevant Imaging Results:  Relevant Lab Results:   Additional Information SSN: [id number]  Alberteen Sam, LCSW

## 2018-10-23 LAB — CK: Total CK: 64 U/L (ref 49–397)

## 2018-10-23 LAB — CBC WITH DIFFERENTIAL/PLATELET
Abs Immature Granulocytes: 0.08 10*3/uL — ABNORMAL HIGH (ref 0.00–0.07)
Basophils Absolute: 0 10*3/uL (ref 0.0–0.1)
Basophils Relative: 0 %
Eosinophils Absolute: 0 10*3/uL (ref 0.0–0.5)
Eosinophils Relative: 0 %
HCT: 37.7 % — ABNORMAL LOW (ref 39.0–52.0)
Hemoglobin: 11.8 g/dL — ABNORMAL LOW (ref 13.0–17.0)
Immature Granulocytes: 1 %
Lymphocytes Relative: 14 %
Lymphs Abs: 0.8 10*3/uL (ref 0.7–4.0)
MCH: 28 pg (ref 26.0–34.0)
MCHC: 31.3 g/dL (ref 30.0–36.0)
MCV: 89.5 fL (ref 80.0–100.0)
Monocytes Absolute: 0.4 10*3/uL (ref 0.1–1.0)
Monocytes Relative: 7 %
Neutro Abs: 4.3 10*3/uL (ref 1.7–7.7)
Neutrophils Relative %: 78 %
Platelets: 201 10*3/uL (ref 150–400)
RBC: 4.21 MIL/uL — ABNORMAL LOW (ref 4.22–5.81)
RDW: 15 % (ref 11.5–15.5)
WBC: 5.6 10*3/uL (ref 4.0–10.5)
nRBC: 0 % (ref 0.0–0.2)

## 2018-10-23 LAB — COMPREHENSIVE METABOLIC PANEL
ALT: 31 U/L (ref 0–44)
AST: 31 U/L (ref 15–41)
Albumin: 2.6 g/dL — ABNORMAL LOW (ref 3.5–5.0)
Alkaline Phosphatase: 60 U/L (ref 38–126)
Anion gap: 10 (ref 5–15)
BUN: 15 mg/dL (ref 6–20)
CO2: 29 mmol/L (ref 22–32)
Calcium: 8.4 mg/dL — ABNORMAL LOW (ref 8.9–10.3)
Chloride: 105 mmol/L (ref 98–111)
Creatinine, Ser: 0.53 mg/dL — ABNORMAL LOW (ref 0.61–1.24)
GFR calc Af Amer: >60 mL/min (ref >60)
GFR calc non Af Amer: >60 mL/min (ref >60)
Glucose, Bld: 157 mg/dL — ABNORMAL HIGH (ref 70–99)
Potassium: 4.4 mmol/L (ref 3.5–5.1)
Sodium: 144 mmol/L (ref 135–145)
Total Bilirubin: 0.1 mg/dL — ABNORMAL LOW (ref 0.3–1.2)
Total Protein: 6.5 g/dL (ref 6.5–8.1)

## 2018-10-23 LAB — MAGNESIUM: Magnesium: 1.8 mg/dL (ref 1.7–2.4)

## 2018-10-23 LAB — LEGIONELLA PNEUMOPHILA SEROGP 1 UR AG: L. pneumophila Serogp 1 Ur Ag: NEGATIVE

## 2018-10-23 LAB — GLUCOSE, CAPILLARY
Glucose-Capillary: 163 mg/dL — ABNORMAL HIGH (ref 70–99)
Glucose-Capillary: 181 mg/dL — ABNORMAL HIGH (ref 70–99)
Glucose-Capillary: 201 mg/dL — ABNORMAL HIGH (ref 70–99)
Glucose-Capillary: 171 mg/dL — ABNORMAL HIGH (ref 70–99)

## 2018-10-23 LAB — C-REACTIVE PROTEIN: CRP: 9.1 mg/dL — ABNORMAL HIGH (ref <1.0)

## 2018-10-23 LAB — PHOSPHORUS: Phosphorus: 2.9 mg/dL (ref 2.5–4.6)

## 2018-10-23 LAB — D-DIMER, QUANTITATIVE: D-Dimer, Quant: 1.46 ug/mL-FEU — ABNORMAL HIGH (ref 0.00–0.50)

## 2018-10-23 LAB — PROCALCITONIN: Procalcitonin: 0.2 ng/mL

## 2018-10-23 LAB — FERRITIN: Ferritin: 2156 ng/mL — ABNORMAL HIGH (ref 24–336)

## 2018-10-23 MED ORDER — LIDOCAINE 5 % EX PTCH
1.0000 | MEDICATED_PATCH | CUTANEOUS | Status: DC
  Administered 2018-10-23: 1 via TRANSDERMAL
  Filled 2018-10-23 (×2): qty 1

## 2018-10-23 MED ORDER — FUROSEMIDE 20 MG PO TABS
20.0000 mg | ORAL_TABLET | Freq: Every day | ORAL | Status: DC
  Administered 2018-10-23 – 2018-11-01 (×10): 20 mg via ORAL
  Filled 2018-10-23 (×10): qty 1

## 2018-10-23 MED ORDER — AZITHROMYCIN 250 MG PO TABS
500.0000 mg | ORAL_TABLET | Freq: Every day | ORAL | Status: AC
  Administered 2018-10-23: 500 mg via ORAL
  Filled 2018-10-23: qty 2

## 2018-10-23 NOTE — Progress Notes (Signed)
PROGRESS NOTE  Luke Phillips LNL:[id number] DOB: 1960-08-25 DOA: 10/21/2018 PCP: System, Provider Not In  HPI/Recap of past 60 hours: 58 year old male with past medical history of severe rheumatoid arthritis with bedbound status on long-term prednisone and pain medications plus chronic respiratory failure from COPD on 4 L of oxygen plus chronic diastolic heart failure and hypertension who was discovered to have COVID-19 4 days prior after an outbreak at the nursing facility which led to everyone being tested, presented to the emergency room on 7/20 2:04 days of shortness of breath.  Patient brought in after having temperature of 101 as well.  In the emergency room, patient initially required nonrebreather, but was able to keep oxygen saturations at 90% with 10 L high flow oxygen.  Patient given dexamethasone and started on Remdisivir.  Chest x-ray noted left lower lobe consolidation, but he was not given Actemra initially due to lab work noting leukopenia and thrombocytopenia.  Patient transferred to [facility].  On 7/25, morning after admission, patient initially improved down to 8 L of oxygen and follow-up labs noted no evidence of leukopenia/thrombocytopenia so he was given a dose of Actemra.  However by that afternoon, oxygen saturations dropped and patient required 15 L high flow.  Limited attempts to Crohn did help somewhat.  Overnight no issues and by this morning, patient down again to 8 L, breathing much more comfortably.  Does complain of continued largely productive cough.  Otherwise says back pain is a little bit better.  Assessment/Plan: Principal Problem: Acute on chronic respiratory failure with hypoxia and hypercapnia secondary to pneumonia due to COVID-19 virus with underlying COPD: Continue steroids and Remdisivir (5 days total).  Discussed with patient that Actemra use in treating COVID is off label, however he consented.  Because of present of infiltrate and mild elevation  procalcitonin, patient received initial antibiotics.  Follow-up procalcitonin today at 0.25.  Given mild improvement, would favor giving total of 3 days of antibiotics.  Patient on baseline 4 L nasal cannula, currently at 8 L high flow, however this is an improvement.  Patient is on Lovenox 40 mg daily given that he is not obese, not severely septic and d-dimer while elevated, well below 5 Active Problems:    Anxiety and depression: Continue home medications    Chronic pain syndrome: Continue home medications    Diabetes mellitus type 2 in obese [facility]): Monitor CBGs, especially in the setting of steroids   Dyslipidemia   Hypertension: Blood pressure is better this morning.   Hypokalemia: Replaced   OSA (obstructive sleep apnea): Avoiding nightly CPAP   Rheumatoid arthritis involving multiple sites [facility]): Hopefully with steroids and Actemra, this does improve his symptoms.  I have restarted lidocaine patch for lower back pain    Code Status: Full code  Family Communication: Updated wife by phone  Disposition Plan: Continue in stepdown.  As oxygenation continues to improve and inflammatory markers come down, can downgrade status.  Home once back to baseline of 4 L, negative COVID test and completed Remdisivir course   Consultants:  None  Procedures:  None  Antimicrobials:  Rocephin and Zithromax 7/24- 7/26  DVT prophylaxis: Lovenox currently at 40 mg daily   Objective: Vitals:   10/23/18 0800 10/23/18 1200  BP: (!) 154/86 (!) 153/89  Pulse: 87 84  Resp: (!) 26 (!) 22  Temp: (!) 97.5 F (36.4 C) 98 F (36.7 C)  SpO2: 94% 100%    Intake/Output Summary (Last 24 hours) at 10/23/2018 1340 Last  data filed at 10/23/2018 0800 Gross per 24 hour  Intake 2414.55 ml  Output 800 ml  Net 1614.55 ml   Filed Weights   10/21/18 0657  Weight: 79.4 kg   Body mass index is 22.47 kg/m.  Exam:   General: Alert and oriented x3, no acute distress  HEENT: Normocephalic and  atraumatic, mucous membranes are slightly dry  Neck: Supple, no JVD  Cardiovascular: Regular rhythm, borderline tachycardia  Respiratory: Decreased breath sounds bibasilar  Abdomen: Soft, nontender, nondistended, hypoactive bowel sounds  Musculoskeletal: No clubbing or cyanosis, trace pitting edema  Skin: No skin breaks, tears or lesions  Psychiatry: Appropriate, no evidence of psychoses  Neuro: Chronic right upper extremity weakness in flexion and extension    Data Reviewed: CBC: Recent Labs  Lab 10/21/18 0856 10/22/18 0400 10/23/18 0555  WBC 3.4* 10.0 5.6  NEUTROABS 2.9 8.6* 4.3  HGB 20.3* 12.1* 11.8*  HCT 63.9* 38.7* 37.7*  MCV 88.0 88.2 89.5  PLT 64* 215 201   Basic Metabolic Panel: Recent Labs  Lab 10/21/18 0856 10/22/18 0400 10/23/18 0555  NA 135 140 144  K 2.9* 3.8 4.4  CL 98 103 105  CO2 25 24 29   GLUCOSE 113* 174* 157*  BUN 13 15 15   CREATININE 0.67 0.49* 0.53*  CALCIUM 7.6* 8.5* 8.4*  MG  --  1.9 1.8  PHOS  --  2.3* 2.9   GFR: Estimated Creatinine Clearance: 111.7 mL/min (A) (by C-G formula based on SCr of 0.53 mg/dL (L)). Liver Function Tests: Recent Labs  Lab 10/21/18 0856 10/22/18 0400 10/23/18 0555  AST 31 30 31   ALT 26 29 31   ALKPHOS 55 67 60  BILITOT 0.6 0.4 0.1*  PROT 5.7* 7.0 6.5  ALBUMIN 2.5* 2.7* 2.6*   No results for input(s): LIPASE, AMYLASE in the last 168 hours. No results for input(s): AMMONIA in the last 168 hours. Coagulation Profile: No results for input(s): INR, PROTIME in the last 168 hours. Cardiac Enzymes: Recent Labs  Lab 10/22/18 0400 10/23/18 0555  CKTOTAL 40* 64   BNP (last 3 results) No results for input(s): PROBNP in the last 8760 hours. HbA1C: No results for input(s): HGBA1C in the last 72 hours. CBG: Recent Labs  Lab 10/22/18 0758 10/22/18 1228 10/22/18 1616 10/22/18 2151 10/23/18 0753  GLUCAP 144* 203* 194* 176* 163*   Lipid Profile: No results for input(s): CHOL, HDL, LDLCALC, TRIG,  CHOLHDL, LDLDIRECT in the last 72 hours. Thyroid Function Tests: No results for input(s): TSH, T4TOTAL, FREET4, T3FREE, THYROIDAB in the last 72 hours. Anemia Panel: Recent Labs    10/22/18 0400 10/23/18 0555  FERRITIN 2,223* 2,156*   Urine analysis: No results found for: COLORURINE, APPEARANCEUR, LABSPEC, PHURINE, GLUCOSEU, HGBUR, BILIRUBINUR, KETONESUR, PROTEINUR, UROBILINOGEN, NITRITE, LEUKOCYTESUR Sepsis Labs: @LABRCNTIP (procalcitonin:4,lacticidven:4)  ) Recent Results (from the past 240 hour(s))  Culture, blood (routine x 2)     Status: None (Preliminary result)   Collection Time: 10/21/18  8:55 AM   Specimen: BLOOD  Result Value Ref Range Status   Specimen Description   Final    BLOOD PORTA CATH Performed at [facility], [address] [place], [place] [address]    Special Requests   Final    BOTTLES DRAWN AEROBIC AND ANAEROBIC Blood Culture adequate volume Performed at [facility], [address] [place], [place] [address]    Culture   Final    NO GROWTH 2 DAYS Performed at [facility], [address] [place],  [place] [address]    Report Status PENDING  Incomplete  Culture, blood (routine x 2)     Status: None (Preliminary result)   Collection Time: 10/21/18  8:56 AM   Specimen: BLOOD  Result Value Ref Range Status   Specimen Description   Final    BLOOD PORTA CATH Performed at [facility], [address] [place], [place] [address]    Special Requests   Final    BOTTLES DRAWN AEROBIC AND ANAEROBIC Blood Culture adequate volume Performed at [facility], [address] [place], [place] [address]    Culture   Final    NO GROWTH 2 DAYS Performed at [facility], [address] [place], [place] [address]    Report Status PENDING  Incomplete  SARS Coronavirus 2 (CEPHEID - Performed in [facility] hospital lab), Hosp Order     Status: Abnormal   Collection Time:  10/21/18  4:54 PM   Specimen: Nasopharyngeal Swab  Result Value Ref Range Status   SARS Coronavirus 2 POSITIVE (A) NEGATIVE Final    Comment: RESULT CALLED TO, READ BACK BY AND VERIFIED WITH: FRICKEY,J. RN @1920  ON 07.24.2020 BY COHEN,K (NOTE) If result is NEGATIVE SARS-CoV-2 target nucleic acids are NOT DETECTED. The SARS-CoV-2 RNA is generally detectable in upper and lower  respiratory specimens during the acute phase of infection. The lowest  concentration of SARS-CoV-2 viral copies this assay can detect is 250  copies / mL. A negative result does not preclude SARS-CoV-2 infection  and should not be used as the sole basis for treatment or other  patient management decisions.  A negative result may occur with  improper specimen collection / handling, submission of specimen other  than nasopharyngeal swab, presence of viral mutation(s) within the  areas targeted by this assay, and inadequate number of viral copies  (<250 copies / mL). A negative result must be combined with clinical  observations, patient history, and epidemiological information. If result is POSITIVE SARS-CoV-2 target nucleic acids are DET ECTED. The SARS-CoV-2 RNA is generally detectable in upper and lower  respiratory specimens during the acute phase of infection.  Positive  results are indicative of active infection with SARS-CoV-2.  Clinical  correlation with patient history and other diagnostic information is  necessary to determine patient infection status.  Positive results do  not rule out bacterial infection or co-infection with other viruses. If result is PRESUMPTIVE POSTIVE SARS-CoV-2 nucleic acids MAY BE PRESENT.   A presumptive positive result was obtained on the submitted specimen  and confirmed on repeat testing.  While 2019 novel coronavirus  (SARS-CoV-2) nucleic acids may be present in the submitted sample  additional confirmatory testing may be necessary for epidemiological  and / or clinical  management purposes  to differentiate between  SARS-CoV-2 and other Sarbecovirus currently known to infect humans.  If clinically indicated additional testing with an alternate test  methodology (LA [phone]) is advised. The SARS-CoV-2 RNA is generally  detectable in upper and lower respiratory specimens during the acute  phase of infection. The expected result is Negative. Fact Sheet for Patients:  G2542 Fact Sheet for Healthcare Providers: BoilerBrush.com.cy This test is not yet approved or cleared by the https://pope.com/ FDA and has been authorized for detection and/or diagnosis of SARS-CoV-2 by FDA under an Emergency Use Authorization (EUA).  This EUA will remain in effect (meaning this test can be used) for the duration of the COVID-19 declaration under Section 564(b)(1) of the Act,  21 U.S.C. section 360bbb-3(b)(1), unless the authorization is terminated or revoked sooner. Performed at [facility], [place] [address] [place], [place] [address]       Studies: No results found.  Scheduled Meds: . aspirin  325 mg Oral Daily  . atorvastatin  40 mg Oral Daily  . baclofen  5 mg Oral TID  . carvedilol  6.25 mg Oral BID WC  . dexamethasone (DECADRON) injection  10 mg Intravenous Q24H  . enoxaparin (LOVENOX) injection  40 mg Subcutaneous Q24H  . furosemide  20 mg Oral Daily  . gabapentin  1,600 mg Oral QHS  . gabapentin  800 mg Oral BID  . insulin aspart  0-5 Units Subcutaneous QHS  . insulin aspart  0-9 Units Subcutaneous TID WC  . insulin glargine  10 Units Subcutaneous QHS  . lactulose  20 g Oral BID  . levETIRAcetam  1,000 mg Oral TID  . lidocaine  1 patch Transdermal Q24H  . morphine  60 mg Oral Q8H  . polyethylene glycol  17 g Oral Daily  . potassium chloride  40 mEq Oral BID  . sertraline  100 mg Oral Daily  . sodium chloride flush  3 mL Intravenous Q12H  . venlafaxine  75 mg Oral TID WC     Continuous Infusions: . sodium chloride 75 mL/hr at 10/22/18 2253  . sodium chloride    . azithromycin 500 mg (10/22/18 1359)  . cefTRIAXone (ROCEPHIN)  IV 2 g (10/22/18 1355)  . remdesivir 100 mg in NS 250 mL 100 mg (10/22/18 1803)     LOS: 2 days     Annita Brod, MD [facility]  To reach me or the doctor on call, go to: [email or web address] Password [email or web address]  10/23/2018, 1:40 PM

## 2018-10-23 NOTE — Progress Notes (Signed)
PHARMACIST - PHYSICIAN COMMUNICATION DR:   [place] Pink and colleagues CONCERNING: Antibiotic IV to Oral Route Change Policy  RECOMMENDATION: This patient is receiving Azithromycin by the intravenous route.  Based on criteria approved by the Pharmacy and Therapeutics Committee, the antibiotic(s) is/are being converted to the equivalent oral dose form(s).   DESCRIPTION: These criteria include:  Patient being treated for a respiratory tract infection, urinary tract infection, cellulitis or clostridium difficile associated diarrhea if on metronidazole  The patient is not neutropenic and does not exhibit a GI malabsorption state  The patient is eating (either orally or via tube) and/or has been taking other orally administered medications for a least 24 hours  The patient is improving clinically and has a Tmax < 100.5  If you have questions about this conversion, please contact the Pharmacy Department [phone])  Thanks,  Sherlon Handing, PharmD, BCPS CGV Clinical pharmacist phone [phone] 10/23/2018 1:51 PM

## 2018-10-24 LAB — COMPREHENSIVE METABOLIC PANEL
ALT: 32 U/L (ref 0–44)
AST: 28 U/L (ref 15–41)
Albumin: 2.8 g/dL — ABNORMAL LOW (ref 3.5–5.0)
Alkaline Phosphatase: 59 U/L (ref 38–126)
Anion gap: 10 (ref 5–15)
BUN: 15 mg/dL (ref 6–20)
CO2: 32 mmol/L (ref 22–32)
Calcium: 8.6 mg/dL — ABNORMAL LOW (ref 8.9–10.3)
Chloride: 100 mmol/L (ref 98–111)
Creatinine, Ser: 0.58 mg/dL — ABNORMAL LOW (ref 0.61–1.24)
GFR calc Af Amer: >60 mL/min (ref >60)
GFR calc non Af Amer: >60 mL/min (ref >60)
Glucose, Bld: 103 mg/dL — ABNORMAL HIGH (ref 70–99)
Potassium: 4.4 mmol/L (ref 3.5–5.1)
Sodium: 142 mmol/L (ref 135–145)
Total Bilirubin: 0.3 mg/dL (ref 0.3–1.2)
Total Protein: 6.8 g/dL (ref 6.5–8.1)

## 2018-10-24 LAB — CBC WITH DIFFERENTIAL/PLATELET
Abs Immature Granulocytes: 0.01 10*3/uL (ref 0.00–0.07)
Basophils Absolute: 0 10*3/uL (ref 0.0–0.1)
Basophils Relative: 0 %
Eosinophils Absolute: 0 10*3/uL (ref 0.0–0.5)
Eosinophils Relative: 0 %
HCT: 40.6 % (ref 39.0–52.0)
Hemoglobin: 12.4 g/dL — ABNORMAL LOW (ref 13.0–17.0)
Immature Granulocytes: 0 %
Lymphocytes Relative: 20 %
Lymphs Abs: 1.1 10*3/uL (ref 0.7–4.0)
MCH: 27.4 pg (ref 26.0–34.0)
MCHC: 30.5 g/dL (ref 30.0–36.0)
MCV: 89.8 fL (ref 80.0–100.0)
Monocytes Absolute: 0.4 10*3/uL (ref 0.1–1.0)
Monocytes Relative: 8 %
Neutro Abs: 3.8 10*3/uL (ref 1.7–7.7)
Neutrophils Relative %: 72 %
Platelets: 218 10*3/uL (ref 150–400)
RBC: 4.52 MIL/uL (ref 4.22–5.81)
RDW: 14.8 % (ref 11.5–15.5)
WBC: 5.3 10*3/uL (ref 4.0–10.5)
nRBC: 0 % (ref 0.0–0.2)

## 2018-10-24 LAB — FERRITIN: Ferritin: 1469 ng/mL — ABNORMAL HIGH (ref 24–336)

## 2018-10-24 LAB — D-DIMER, QUANTITATIVE: D-Dimer, Quant: 1.17 ug/mL-FEU — ABNORMAL HIGH (ref 0.00–0.50)

## 2018-10-24 LAB — C-REACTIVE PROTEIN: CRP: 4.1 mg/dL — ABNORMAL HIGH (ref <1.0)

## 2018-10-24 LAB — PHOSPHORUS: Phosphorus: 2.4 mg/dL — ABNORMAL LOW (ref 2.5–4.6)

## 2018-10-24 LAB — CK: Total CK: 35 U/L — ABNORMAL LOW (ref 49–397)

## 2018-10-24 LAB — GLUCOSE, CAPILLARY
Glucose-Capillary: 95 mg/dL (ref 70–99)
Glucose-Capillary: 184 mg/dL — ABNORMAL HIGH (ref 70–99)
Glucose-Capillary: 237 mg/dL — ABNORMAL HIGH (ref 70–99)

## 2018-10-24 LAB — MAGNESIUM: Magnesium: 1.7 mg/dL (ref 1.7–2.4)

## 2018-10-24 MED ORDER — LIDOCAINE 5 % EX PTCH
2.0000 | MEDICATED_PATCH | CUTANEOUS | Status: DC
  Administered 2018-10-24 – 2018-10-31 (×8): 2 via TRANSDERMAL
  Filled 2018-10-24 (×10): qty 2

## 2018-10-24 MED ORDER — GUAIFENESIN-DM 100-10 MG/5ML PO SYRP
15.0000 mL | ORAL_SOLUTION | ORAL | Status: DC | PRN
  Administered 2018-10-24 – 2018-11-01 (×13): 15 mL via ORAL
  Filled 2018-10-24 (×13): qty 20

## 2018-10-24 MED ORDER — HYDROCOD POLST-CPM POLST ER 10-8 MG/5ML PO SUER
5.0000 mL | Freq: Two times a day (BID) | ORAL | Status: DC | PRN
  Administered 2018-10-28 – 2018-10-31 (×3): 5 mL via ORAL
  Filled 2018-10-24 (×3): qty 5

## 2018-10-24 NOTE — Care Management Important Message (Signed)
Important Message  Patient Details  Name: Rollan Roger MRN: [id number] Date of Birth: 19-Jul-1960   Medicare Important Message Given:  Yes - Important Message mailed due to current National Emergency   Verbal consent obtained due to current National Emergency    Contact Name: Leelynn Whetsel Call Date: 10/24/18  Time: 1533 Phone: [phone] Outcome: Spoke with contact Important Message mailed to: Patient address on file      [address] 10/24/2018, 3:34 PM

## 2018-10-24 NOTE — Progress Notes (Addendum)
PROGRESS NOTE  Luke Phillips JSH:[id number] DOB: Feb 17, 1961 DOA: 10/21/2018 PCP: System, Provider Not In  HPI/Recap of past 10 hours: 58 year old male with past medical history of severe rheumatoid arthritis with bedbound status on long-term prednisone and pain medications plus chronic respiratory failure from COPD on 4 L of oxygen plus chronic diastolic heart failure and hypertension who was discovered to have COVID-19 4 days prior after an outbreak at the nursing facility which led to everyone being tested, presented to the emergency room on 7/20 2:04 days of shortness of breath.  Patient brought in after having temperature of 101 as well.  In the emergency room, patient initially required nonrebreather, but was able to keep oxygen saturations at 90% with 10 L high flow oxygen.  Patient given dexamethasone and started on Remdisivir.  Chest x-ray noted left lower lobe consolidation, but he was not given Actemra initially due to lab work noting leukopenia and thrombocytopenia.  Patient transferred to [facility] campus.  On 7/25, morning after admission, patient initially improved down to 8 L of oxygen and follow-up labs noted no evidence of leukopenia/thrombocytopenia so he was given a dose of Actemra.  However by that afternoon, oxygen saturations dropped and patient required 15 L high flow.  Limited attempts to prone did help somewhat.  Over the past 2 days, patient slowly improving.  Maintaining now at 8 L with oxygen saturations close to 100%.  Main complaint is of highly productive cough and some discomfort with that, but overall he says he feels like breathing is better  Assessment/Plan: Principal Problem: Acute on chronic respiratory failure with hypoxia and hypercapnia secondary to pneumonia due to COVID-19 virus with underlying COPD: Continue steroids and Remdisivir (5 days total).  Discussed with patient that Actemra use in treating COVID is off label, however he consented.  Because of  present of infiltrate and mild elevation procalcitonin, patient received initial antibiotics.  Follow-up procalcitonin today at 0.25.  Given mild improvement, would favor giving total of 3 days of antibiotics.  Patient on baseline 4 L nasal cannula, currently at 8 L high flow, however this is an improvement.  Hopefully should be able to wean down soon patient is on Lovenox 40 mg daily given that he is not obese, not severely septic and d-dimer while elevated, well below 5 Active Problems:    Anxiety and depression: Continue home medications    Chronic pain syndrome: Continue home medications    Diabetes mellitus type 2 in obese [facility]): Monitor CBGs, especially in the setting of steroids   Dyslipidemia   Hypertension: Blood pressure stable  Hypokalemia: Replaced   OSA (obstructive sleep apnea): Avoiding nightly CPAP   Rheumatoid arthritis involving multiple sites [facility]): Hopefully with steroids and Actemra, this does improve his symptoms.  I have restarted lidocaine patch for lower back pain and he has requested to for his right arm as he normally takes that at home   Code Status: Full code  Family Communication: Updated wife by phone.  Disposition Plan: Change to telemetry from stepdown.  As oxygenation continues to improve and inflammatory markers come down, can downgrade status.  Home once back to baseline of 4 L, negative COVID test and completed Remdisivir course   Consultants:  None  Procedures:  None  Antimicrobials:  Rocephin and Zithromax 7/24- 7/26  DVT prophylaxis: Lovenox currently at 40 mg daily   Objective: Vitals:   10/24/18 0800 10/24/18 1100  BP: (!) 156/92 128/89  Pulse:  89  Resp:  20  Temp:  98.3 F (36.8 C)  SpO2:  98%    Intake/Output Summary (Last 24 hours) at 10/24/2018 1158 Last data filed at 10/24/2018 0432 Gross per 24 hour  Intake 523 ml  Output 2550 ml  Net -2027 ml   Filed Weights   10/21/18 0657  Weight: 79.4 kg   Body mass index  is 22.47 kg/m.  Exam:   General: Alert and oriented x3, no acute distress  HEENT: Normocephalic and atraumatic, mucous membranes are slightly dry  Neck: Supple, no JVD  Cardiovascular: Regular rhythm, borderline tachycardia  Respiratory: Scattered rhonchi  Abdomen: Soft, nontender, nondistended, hypoactive bowel sounds  Musculoskeletal: No clubbing or cyanosis, trace pitting edema  Skin: No skin breaks, tears or lesions  Psychiatry: Appropriate, no evidence of psychoses  Neuro: Chronic right upper extremity weakness in flexion and extension    Data Reviewed: CBC: Recent Labs  Lab 10/21/18 0856 10/22/18 0400 10/23/18 0555 10/24/18 0615  WBC 3.4* 10.0 5.6 5.3  NEUTROABS 2.9 8.6* 4.3 3.8  HGB 20.3* 12.1* 11.8* 12.4*  HCT 63.9* 38.7* 37.7* 40.6  MCV 88.0 88.2 89.5 89.8  PLT 64* 215 201 354   Basic Metabolic Panel: Recent Labs  Lab 10/21/18 0856 10/22/18 0400 10/23/18 0555 10/24/18 0615  NA 135 140 144 142  K 2.9* 3.8 4.4 4.4  CL 98 103 105 100  CO2 25 24 29  32  GLUCOSE 113* 174* 157* 103*  BUN 13 15 15 15   CREATININE 0.67 0.49* 0.53* 0.58*  CALCIUM 7.6* 8.5* 8.4* 8.6*  MG  --  1.9 1.8 1.7  PHOS  --  2.3* 2.9 2.4*   GFR: Estimated Creatinine Clearance: 111.7 mL/min (A) (by C-G formula based on SCr of 0.58 mg/dL (L)). Liver Function Tests: Recent Labs  Lab 10/21/18 0856 10/22/18 0400 10/23/18 0555 10/24/18 0615  AST 31 30 31 28   ALT 26 29 31  32  ALKPHOS 55 67 60 59  BILITOT 0.6 0.4 0.1* 0.3  PROT 5.7* 7.0 6.5 6.8  ALBUMIN 2.5* 2.7* 2.6* 2.8*   No results for input(s): LIPASE, AMYLASE in the last 168 hours. No results for input(s): AMMONIA in the last 168 hours. Coagulation Profile: No results for input(s): INR, PROTIME in the last 168 hours. Cardiac Enzymes: Recent Labs  Lab 10/22/18 0400 10/23/18 0555 10/24/18 0615  CKTOTAL 40* 64 35*   BNP (last 3 results) No results for input(s): PROBNP in the last 8760 hours. HbA1C: No results  for input(s): HGBA1C in the last 72 hours. CBG: Recent Labs  Lab 10/23/18 1141 10/23/18 1642 10/23/18 2203 10/24/18 0807 10/24/18 1108  GLUCAP 181* 201* 171* 95 184*   Lipid Profile: No results for input(s): CHOL, HDL, LDLCALC, TRIG, CHOLHDL, LDLDIRECT in the last 72 hours. Thyroid Function Tests: No results for input(s): TSH, T4TOTAL, FREET4, T3FREE, THYROIDAB in the last 72 hours. Anemia Panel: Recent Labs    10/23/18 0555 10/24/18 0615  FERRITIN 2,156* 1,469*   Urine analysis: No results found for: COLORURINE, APPEARANCEUR, LABSPEC, PHURINE, GLUCOSEU, HGBUR, BILIRUBINUR, KETONESUR, PROTEINUR, UROBILINOGEN, NITRITE, LEUKOCYTESUR Sepsis Labs: @LABRCNTIP (procalcitonin:4,lacticidven:4)  ) Recent Results (from the past 240 hour(s))  Culture, blood (routine x 2)     Status: None (Preliminary result)   Collection Time: 10/21/18  8:55 AM   Specimen: BLOOD  Result Value Ref Range Status   Specimen Description   Final    BLOOD PORTA CATH Performed at [facility] [address] [place], [place] [address]    Special Requests  Final    BOTTLES DRAWN AEROBIC AND ANAEROBIC Blood Culture adequate volume Performed at [facility], [address], [place], [place] [address]    Culture   Final    NO GROWTH 3 DAYS Performed at [facility], [address], [place], [place] [address]    Report Status PENDING  Incomplete  Culture, blood (routine x 2)     Status: None (Preliminary result)   Collection Time: 10/21/18  8:56 AM   Specimen: BLOOD  Result Value Ref Range Status   Specimen Description   Final    BLOOD PORTA CATH Performed at [facility], [address], [place], [place] [address]    Special Requests   Final    BOTTLES DRAWN AEROBIC AND ANAEROBIC Blood Culture adequate volume Performed at [facility], [address] [place], [place] [address]    Culture   Final    NO GROWTH 3 DAYS  Performed at [facility], [address], [place], [place] [address]    Report Status PENDING  Incomplete  SARS Coronavirus 2 (CEPHEID - Performed in [facility] hospital lab), Hosp Order     Status: Abnormal   Collection Time: 10/21/18  4:54 PM   Specimen: Nasopharyngeal Swab  Result Value Ref Range Status   SARS Coronavirus 2 POSITIVE (A) NEGATIVE Final    Comment: RESULT CALLED TO, READ BACK BY AND VERIFIED WITH: FRICKEY,J. RN @1920  ON 07.24.2020 BY COHEN,K (NOTE) If result is NEGATIVE SARS-CoV-2 target nucleic acids are NOT DETECTED. The SARS-CoV-2 RNA is generally detectable in upper and lower  respiratory specimens during the acute phase of infection. The lowest  concentration of SARS-CoV-2 viral copies this assay can detect is 250  copies / mL. A negative result does not preclude SARS-CoV-2 infection  and should not be used as the sole basis for treatment or other  patient management decisions.  A negative result may occur with  improper specimen collection / handling, submission of specimen other  than nasopharyngeal swab, presence of viral mutation(s) within the  areas targeted by this assay, and inadequate number of viral copies  (<250 copies / mL). A negative result must be combined with clinical  observations, patient history, and epidemiological information. If result is POSITIVE SARS-CoV-2 target nucleic acids are DET ECTED. The SARS-CoV-2 RNA is generally detectable in upper and lower  respiratory specimens during the acute phase of infection.  Positive  results are indicative of active infection with SARS-CoV-2.  Clinical  correlation with patient history and other diagnostic information is  necessary to determine patient infection status.  Positive results do  not rule out bacterial infection or co-infection with other viruses. If result is PRESUMPTIVE POSTIVE SARS-CoV-2 nucleic acids MAY BE PRESENT.   A presumptive positive result was obtained on the  submitted specimen  and confirmed on repeat testing.  While 2019 novel coronavirus  (SARS-CoV-2) nucleic acids may be present in the submitted sample  additional confirmatory testing may be necessary for epidemiological  and / or clinical management purposes  to differentiate between  SARS-CoV-2 and other Sarbecovirus currently known to infect humans.  If clinically indicated additional testing with an alternate test  methodology (LA [phone]) is advised. The SARS-CoV-2 RNA is generally  detectable in upper and lower respiratory specimens during the acute  phase of infection. The expected result is Negative. Fact Sheet for Patients:  BoilerBrush.com.cy Fact Sheet for Healthcare Providers: https://pope.com/ This test is not yet approved  or cleared by the [place] FDA and has been authorized for detection and/or diagnosis of SARS-CoV-2 by FDA under an Emergency Use Authorization (EUA).  This EUA will remain in effect (meaning this test can be used) for the duration of the COVID-19 declaration under Section 564(b)(1) of the Act, 21 U.S.C. section 360bbb-3(b)(1), unless the authorization is terminated or revoked sooner. Performed at [facility], [address] [place], [place] [address]       Studies: No results found.  Scheduled Meds: . aspirin  325 mg Oral Daily  . atorvastatin  40 mg Oral Daily  . baclofen  5 mg Oral TID  . carvedilol  6.25 mg Oral BID WC  . dexamethasone (DECADRON) injection  10 mg Intravenous Q24H  . enoxaparin (LOVENOX) injection  40 mg Subcutaneous Q24H  . furosemide  20 mg Oral Daily  . gabapentin  1,600 mg Oral QHS  . gabapentin  800 mg Oral BID  . insulin aspart  0-5 Units Subcutaneous QHS  . insulin aspart  0-9 Units Subcutaneous TID WC  . insulin glargine  10 Units Subcutaneous QHS  . lactulose  20 g Oral BID  . levETIRAcetam  1,000 mg Oral TID  . lidocaine  2 patch Transdermal  Q24H  . morphine  60 mg Oral Q8H  . polyethylene glycol  17 g Oral Daily  . potassium chloride  40 mEq Oral BID  . sertraline  100 mg Oral Daily  . sodium chloride flush  3 mL Intravenous Q12H  . venlafaxine  75 mg Oral TID WC    Continuous Infusions: . sodium chloride 75 mL/hr at 10/22/18 2253  . sodium chloride    . remdesivir 100 mg in NS 250 mL 100 mg (10/23/18 1707)     LOS: 3 days     Hollice EspySendil K Kosei Rhodes, MD [facility]  To reach me or the doctor on call, go to: [email or web address] Password [email or web address]  10/24/2018, 11:58 AM

## 2018-10-25 LAB — CBC WITH DIFFERENTIAL/PLATELET
Abs Immature Granulocytes: 0.02 10*3/uL (ref 0.00–0.07)
Basophils Absolute: 0 10*3/uL (ref 0.0–0.1)
Basophils Relative: 0 %
Eosinophils Absolute: 0 10*3/uL (ref 0.0–0.5)
Eosinophils Relative: 0 %
HCT: 41.3 % (ref 39.0–52.0)
Hemoglobin: 12.5 g/dL — ABNORMAL LOW (ref 13.0–17.0)
Immature Granulocytes: 0 %
Lymphocytes Relative: 22 %
Lymphs Abs: 1.3 10*3/uL (ref 0.7–4.0)
MCH: 26.9 pg (ref 26.0–34.0)
MCHC: 30.3 g/dL (ref 30.0–36.0)
MCV: 88.8 fL (ref 80.0–100.0)
Monocytes Absolute: 0.6 10*3/uL (ref 0.1–1.0)
Monocytes Relative: 9 %
Neutro Abs: 4 10*3/uL (ref 1.7–7.7)
Neutrophils Relative %: 69 %
Platelets: 226 10*3/uL (ref 150–400)
RBC: 4.65 MIL/uL (ref 4.22–5.81)
RDW: 14.7 % (ref 11.5–15.5)
WBC: 5.9 10*3/uL (ref 4.0–10.5)
nRBC: 0 % (ref 0.0–0.2)

## 2018-10-25 LAB — COMPREHENSIVE METABOLIC PANEL
ALT: 32 U/L (ref 0–44)
AST: 23 U/L (ref 15–41)
Albumin: 3 g/dL — ABNORMAL LOW (ref 3.5–5.0)
Alkaline Phosphatase: 62 U/L (ref 38–126)
Anion gap: 9 (ref 5–15)
BUN: 19 mg/dL (ref 6–20)
CO2: 34 mmol/L — ABNORMAL HIGH (ref 22–32)
Calcium: 8.5 mg/dL — ABNORMAL LOW (ref 8.9–10.3)
Chloride: 97 mmol/L — ABNORMAL LOW (ref 98–111)
Creatinine, Ser: 0.6 mg/dL — ABNORMAL LOW (ref 0.61–1.24)
GFR calc Af Amer: >60 mL/min (ref >60)
GFR calc non Af Amer: >60 mL/min (ref >60)
Glucose, Bld: 89 mg/dL (ref 70–99)
Potassium: 4.1 mmol/L (ref 3.5–5.1)
Sodium: 140 mmol/L (ref 135–145)
Total Bilirubin: 0.2 mg/dL — ABNORMAL LOW (ref 0.3–1.2)
Total Protein: 6.9 g/dL (ref 6.5–8.1)

## 2018-10-25 LAB — GLUCOSE, CAPILLARY
Glucose-Capillary: 204 mg/dL — ABNORMAL HIGH (ref 70–99)
Glucose-Capillary: 94 mg/dL (ref 70–99)
Glucose-Capillary: 218 mg/dL — ABNORMAL HIGH (ref 70–99)
Glucose-Capillary: 245 mg/dL — ABNORMAL HIGH (ref 70–99)
Glucose-Capillary: 221 mg/dL — ABNORMAL HIGH (ref 70–99)

## 2018-10-25 LAB — TROPONIN I (HIGH SENSITIVITY)
Troponin I (High Sensitivity): 5 ng/L (ref <18)
Troponin I (High Sensitivity): 4 ng/L (ref <18)

## 2018-10-25 LAB — CK: Total CK: 29 U/L — ABNORMAL LOW (ref 49–397)

## 2018-10-25 LAB — MAGNESIUM: Magnesium: 1.8 mg/dL (ref 1.7–2.4)

## 2018-10-25 LAB — FERRITIN: Ferritin: 1144 ng/mL — ABNORMAL HIGH (ref 24–336)

## 2018-10-25 LAB — D-DIMER, QUANTITATIVE: D-Dimer, Quant: 1.22 ug/mL-FEU — ABNORMAL HIGH (ref 0.00–0.50)

## 2018-10-25 LAB — PHOSPHORUS: Phosphorus: 2.8 mg/dL (ref 2.5–4.6)

## 2018-10-25 LAB — C-REACTIVE PROTEIN: CRP: 2 mg/dL — ABNORMAL HIGH (ref <1.0)

## 2018-10-25 MED ORDER — NITROGLYCERIN 0.4 MG SL SUBL
0.4000 mg | SUBLINGUAL_TABLET | SUBLINGUAL | Status: DC | PRN
  Administered 2018-10-25: 0.4 mg via SUBLINGUAL

## 2018-10-25 NOTE — Progress Notes (Signed)
Pt c/o chest pain and tightness. MD informed. EKG preformed and labs sent per order. MD aware.  Nitro given x1. Pt stated that pain was better after that.  Still had some tightness, but felt it was more related to breathing. PRN inhaler given. VSS. Will CTM closely at this time.

## 2018-10-25 NOTE — Progress Notes (Signed)
PROGRESS NOTE  Luke Phillips ERD:[id number] DOB: 02/11/1961 DOA: 10/21/2018 PCP: System, Provider Not In  HPI/Recap of past 31 hours: 58 year old male with past medical history of severe rheumatoid arthritis with bedbound status on long-term prednisone and pain medications plus chronic respiratory failure from COPD on 4 L of oxygen plus chronic diastolic heart failure and hypertension who was discovered to have COVID-19 4 days prior after an outbreak at the nursing facility which led to everyone being tested, presented to the emergency room on 7/20 2:04 days of shortness of breath.  Patient brought in after having temperature of 101 as well.  In the emergency room, patient initially required nonrebreather, but was able to keep oxygen saturations at 90% with 10 L high flow oxygen.  Patient given dexamethasone and started on Remdisivir.  Chest x-ray noted left lower lobe consolidation, but he was not given Actemra initially due to lab work noting leukopenia and thrombocytopenia.  Patient transferred to [place].  On 7/25, morning after admission, patient initially improved down to 8 L of oxygen and follow-up labs noted no evidence of leukopenia/thrombocytopenia so he was given a dose of Actemra.  However by that afternoon, oxygen saturations dropped and patient required 15 L high flow.  Limited attempts to prone did help somewhat.  Over the past few days, patient slowly improving.  Overnight, patient was complaining of some chest pain, but also felt to be more related to bronchospasm and eased off.  This morning, his breathing is a little bit better, he still complains of cough.    Assessment/Plan: Principal Problem: Acute on chronic respiratory failure with hypoxia and hypercapnia secondary to pneumonia due to COVID-19 virus with underlying COPD: Continue steroids.  Completed Remdisivir on 7/28 discussed with patient that Actemra use in treating COVID is off label, however he consented.   Because of present of infiltrate and mild elevation procalcitonin, patient received initial antibiotics.  Follow-up procalcitonin today at 0.25.  Given mild improvement, would favor giving total of 3 days of antibiotics.  Patient on baseline 4 L nasal cannula, currently at 6 L high flow, however this is an improvement.  Hopefully should be able to wean down soon patient is on Lovenox 40 mg daily given that he is not obese, not severely septic and d-dimer while elevated, well below 5 Active Problems:    Anxiety and depression: Continue home medications    Chronic pain syndrome: Continue home medications    Diabetes mellitus type 2 in obese [facility]): Monitor CBGs, especially in the setting of steroids   Dyslipidemia    Hypertension: Blood pressure stable  Hypokalemia: Replaced    OSA (obstructive sleep apnea): AvoidiNg nightly CPAP while at [facility]    Rheumatoid arthritis involving multiple sites [facility]): Hopefully with steroids and Actemra, this does improve his symptoms.  I have restarted lidocaine patch for lower back and right arm pain    Code Status: Full code  Family Communication: Updated wife by phone.  Disposition Plan: Change to telemetry from stepdown.  As oxygenation continues to improve and inflammatory markers come down, can downgrade status.  Home once back to baseline of 4 L, negative COVID test and completed Remdisivir course   Consultants:  None  Procedures:  None  Antimicrobials:  Rocephin and Zithromax 7/24- 7/26  DVT prophylaxis: Lovenox currently at 40 mg daily   Objective: Vitals:   10/25/18 0822 10/25/18 1159  BP: (!) 148/83 105/79  Pulse: (!) 103 93  Resp: 17 15  Temp: 97.7  F (36.5 C) 98.3 F (36.8 C)  SpO2: 90% 97%    Intake/Output Summary (Last 24 hours) at 10/25/2018 1347 Last data filed at 10/25/2018 1233 Gross per 24 hour  Intake 1558 ml  Output 3325 ml  Net -1767 ml   Filed Weights   10/21/18 0657  Weight: 79.4 kg   Body  mass index is 22.47 kg/m.  Exam:   General: Alert and oriented x3, no acute distress  HEENT: Normocephalic and atraumatic, mucous membranes are slightly dry  Neck: Supple, no JVD  Cardiovascular: Regular rhythm, borderline tachycardia  Respiratory: Few rhonchi  Abdomen: Soft, nontender, nondistended, hypoactive bowel sounds  Musculoskeletal: No clubbing or cyanosis, trace pitting edema  Skin: No skin breaks, tears or lesions  Psychiatry: Appropriate, no evidence of psychoses  Neuro: Chronic right upper extremity weakness in flexion and extension    Data Reviewed: CBC: Recent Labs  Lab 10/21/18 0856 10/22/18 0400 10/23/18 0555 10/24/18 0615 10/25/18 0500  WBC 3.4* 10.0 5.6 5.3 5.9  NEUTROABS 2.9 8.6* 4.3 3.8 4.0  HGB 20.3* 12.1* 11.8* 12.4* 12.5*  HCT 63.9* 38.7* 37.7* 40.6 41.3  MCV 88.0 88.2 89.5 89.8 88.8  PLT 64* 215 201 218 226   Basic Metabolic Panel: Recent Labs  Lab 10/21/18 0856 10/22/18 0400 10/23/18 0555 10/24/18 0615 10/25/18 0500  NA 135 140 144 142 140  K 2.9* 3.8 4.4 4.4 4.1  CL 98 103 105 100 97*  CO2 25 24 29  32 34*  GLUCOSE 113* 174* 157* 103* 89  BUN 13 15 15 15 19   CREATININE 0.67 0.49* 0.53* 0.58* 0.60*  CALCIUM 7.6* 8.5* 8.4* 8.6* 8.5*  MG  --  1.9 1.8 1.7 1.8  PHOS  --  2.3* 2.9 2.4* 2.8   GFR: Estimated Creatinine Clearance: 111.7 mL/min (A) (by C-G formula based on SCr of 0.6 mg/dL (L)). Liver Function Tests: Recent Labs  Lab 10/21/18 0856 10/22/18 0400 10/23/18 0555 10/24/18 0615 10/25/18 0500  AST 31 30 31 28 23   ALT 26 29 31  32 32  ALKPHOS 55 67 60 59 62  BILITOT 0.6 0.4 0.1* 0.3 0.2*  PROT 5.7* 7.0 6.5 6.8 6.9  ALBUMIN 2.5* 2.7* 2.6* 2.8* 3.0*   No results for input(s): LIPASE, AMYLASE in the last 168 hours. No results for input(s): AMMONIA in the last 168 hours. Coagulation Profile: No results for input(s): INR, PROTIME in the last 168 hours. Cardiac Enzymes: Recent Labs  Lab 10/22/18 0400 10/23/18  0555 10/24/18 0615 10/25/18 0500  CKTOTAL 40* 64 35* 29*   BNP (last 3 results) No results for input(s): PROBNP in the last 8760 hours. HbA1C: No results for input(s): HGBA1C in the last 72 hours. CBG: Recent Labs  Lab 10/24/18 1108 10/24/18 1624 10/24/18 2135 10/25/18 0819 10/25/18 1134  GLUCAP 184* 237* 204* 94 218*   Lipid Profile: No results for input(s): CHOL, HDL, LDLCALC, TRIG, CHOLHDL, LDLDIRECT in the last 72 hours. Thyroid Function Tests: No results for input(s): TSH, T4TOTAL, FREET4, T3FREE, THYROIDAB in the last 72 hours. Anemia Panel: Recent Labs    10/24/18 0615 10/25/18 0430  FERRITIN 1,469* 1,144*   Urine analysis: No results found for: COLORURINE, APPEARANCEUR, LABSPEC, PHURINE, GLUCOSEU, HGBUR, BILIRUBINUR, KETONESUR, PROTEINUR, UROBILINOGEN, NITRITE, LEUKOCYTESUR Sepsis Labs: @LABRCNTIP (procalcitonin:4,lacticidven:4)  ) Recent Results (from the past 240 hour(s))  Culture, blood (routine x 2)     Status: None (Preliminary result)   Collection Time: 10/21/18  8:55 AM   Specimen: BLOOD  Result Value Ref Range Status  Specimen Description   Final    BLOOD PORTA CATH Performed at [facility], [address], [place], [place] [address]    Special Requests   Final    BOTTLES DRAWN AEROBIC AND ANAEROBIC Blood Culture adequate volume Performed at [facility], [address] [place], [place] [address]    Culture   Final    NO GROWTH 4 DAYS Performed at [facility], [address], [place], [place] [address]    Report Status PENDING  Incomplete  Culture, blood (routine x 2)     Status: None (Preliminary result)   Collection Time: 10/21/18  8:56 AM   Specimen: BLOOD  Result Value Ref Range Status   Specimen Description   Final    BLOOD PORTA CATH Performed at [facility], [address], [place], [place] [address]    Special Requests   Final    BOTTLES DRAWN AEROBIC AND  ANAEROBIC Blood Culture adequate volume Performed at [facility], [address], [place], [place] [address]    Culture   Final    NO GROWTH 4 DAYS Performed at [facility], [address] [place], [place] [address]    Report Status PENDING  Incomplete  SARS Coronavirus 2 (CEPHEID - Performed in [facility] hospital lab), Hosp Order     Status: Abnormal   Collection Time: 10/21/18  4:54 PM   Specimen: Nasopharyngeal Swab  Result Value Ref Range Status   SARS Coronavirus 2 POSITIVE (A) NEGATIVE Final    Comment: RESULT CALLED TO, READ BACK BY AND VERIFIED WITH: FRICKEY,J. RN @1920  ON 07.24.2020 BY COHEN,K (NOTE) If result is NEGATIVE SARS-CoV-2 target nucleic acids are NOT DETECTED. The SARS-CoV-2 RNA is generally detectable in upper and lower  respiratory specimens during the acute phase of infection. The lowest  concentration of SARS-CoV-2 viral copies this assay can detect is 250  copies / mL. A negative result does not preclude SARS-CoV-2 infection  and should not be used as the sole basis for treatment or other  patient management decisions.  A negative result may occur with  improper specimen collection / handling, submission of specimen other  than nasopharyngeal swab, presence of viral mutation(s) within the  areas targeted by this assay, and inadequate number of viral copies  (<250 copies / mL). A negative result must be combined with clinical  observations, patient history, and epidemiological information. If result is POSITIVE SARS-CoV-2 target nucleic acids are DET ECTED. The SARS-CoV-2 RNA is generally detectable in upper and lower  respiratory specimens during the acute phase of infection.  Positive  results are indicative of active infection with SARS-CoV-2.  Clinical  correlation with patient history and other diagnostic information is  necessary to determine patient infection status.  Positive results do  not rule out bacterial  infection or co-infection with other viruses. If result is PRESUMPTIVE POSTIVE SARS-CoV-2 nucleic acids MAY BE PRESENT.   A presumptive positive result was obtained on the submitted specimen  and confirmed on repeat testing.  While 2019 novel coronavirus  (SARS-CoV-2) nucleic acids may be present in the submitted sample  additional confirmatory testing may be necessary for epidemiological  and / or clinical management purposes  to differentiate between  SARS-CoV-2 and other Sarbecovirus currently known to infect humans.  If clinically indicated additional testing with an alternate test  methodology (LA [phone]) is advised. The SARS-CoV-2 RNA is generally  detectable in upper and lower respiratory  specimens during the acute  phase of infection. The expected result is Negative. Fact Sheet for Patients:  BoilerBrush.com.cy Fact Sheet for Healthcare Providers: https://pope.com/ This test is not yet approved or cleared by the [place] FDA and has been authorized for detection and/or diagnosis of SARS-CoV-2 by FDA under an Emergency Use Authorization (EUA).  This EUA will remain in effect (meaning this test can be used) for the duration of the COVID-19 declaration under Section 564(b)(1) of the Act, 21 U.S.C. section 360bbb-3(b)(1), unless the authorization is terminated or revoked sooner. Performed at [facility], [address] [place], [place] [address]       Studies: No results found.  Scheduled Meds: . aspirin  325 mg Oral Daily  . atorvastatin  40 mg Oral Daily  . baclofen  5 mg Oral TID  . carvedilol  6.25 mg Oral BID WC  . dexamethasone (DECADRON) injection  10 mg Intravenous Q24H  . enoxaparin (LOVENOX) injection  40 mg Subcutaneous Q24H  . furosemide  20 mg Oral Daily  . gabapentin  1,600 mg Oral QHS  . gabapentin  800 mg Oral BID  . insulin aspart  0-5 Units Subcutaneous QHS  . insulin aspart  0-9  Units Subcutaneous TID WC  . insulin glargine  10 Units Subcutaneous QHS  . lactulose  20 g Oral BID  . levETIRAcetam  1,000 mg Oral TID  . lidocaine  2 patch Transdermal Q24H  . morphine  60 mg Oral Q8H  . polyethylene glycol  17 g Oral Daily  . potassium chloride  40 mEq Oral BID  . sertraline  100 mg Oral Daily  . sodium chloride flush  3 mL Intravenous Q12H  . venlafaxine  75 mg Oral TID WC    Continuous Infusions: . sodium chloride 10 mL/hr at 10/25/18 0600  . sodium chloride    . remdesivir 100 mg in NS 250 mL Stopped (10/24/18 1824)     LOS: 4 days     Hollice Espy, MD [facility]  To reach me or the doctor on call, go to: [email or web address] Password [email or web address]  10/25/2018, 1:47 PM

## 2018-10-26 DIAGNOSIS — G894 Chronic pain syndrome: Secondary | ICD-10-CM

## 2018-10-26 DIAGNOSIS — E1169 Type 2 diabetes mellitus with other specified complication: Secondary | ICD-10-CM

## 2018-10-26 DIAGNOSIS — J069 Acute upper respiratory infection, unspecified: Secondary | ICD-10-CM

## 2018-10-26 DIAGNOSIS — E669 Obesity, unspecified: Secondary | ICD-10-CM

## 2018-10-26 DIAGNOSIS — M0579 Rheumatoid arthritis with rheumatoid factor of multiple sites without organ or systems involvement: Secondary | ICD-10-CM

## 2018-10-26 LAB — GLUCOSE, CAPILLARY
Glucose-Capillary: 108 mg/dL — ABNORMAL HIGH (ref 70–99)
Glucose-Capillary: 183 mg/dL — ABNORMAL HIGH (ref 70–99)
Glucose-Capillary: 301 mg/dL — ABNORMAL HIGH (ref 70–99)
Glucose-Capillary: 149 mg/dL — ABNORMAL HIGH (ref 70–99)

## 2018-10-26 LAB — CULTURE, BLOOD (ROUTINE X 2)
Culture: NO GROWTH
Culture: NO GROWTH
Special Requests: ADEQUATE
Special Requests: ADEQUATE

## 2018-10-26 LAB — CBC WITH DIFFERENTIAL/PLATELET
Abs Immature Granulocytes: 0.06 10*3/uL (ref 0.00–0.07)
Basophils Absolute: 0 10*3/uL (ref 0.0–0.1)
Basophils Relative: 0 %
Eosinophils Absolute: 0 10*3/uL (ref 0.0–0.5)
Eosinophils Relative: 0 %
HCT: 42.3 % (ref 39.0–52.0)
Hemoglobin: 13.2 g/dL (ref 13.0–17.0)
Immature Granulocytes: 1 %
Lymphocytes Relative: 31 %
Lymphs Abs: 2.2 10*3/uL (ref 0.7–4.0)
MCH: 27.4 pg (ref 26.0–34.0)
MCHC: 31.2 g/dL (ref 30.0–36.0)
MCV: 87.9 fL (ref 80.0–100.0)
Monocytes Absolute: 0.7 10*3/uL (ref 0.1–1.0)
Monocytes Relative: 10 %
Neutro Abs: 4.3 10*3/uL (ref 1.7–7.7)
Neutrophils Relative %: 58 %
Platelets: 234 10*3/uL (ref 150–400)
RBC: 4.81 MIL/uL (ref 4.22–5.81)
RDW: 14.7 % (ref 11.5–15.5)
WBC: 7.3 10*3/uL (ref 4.0–10.5)
nRBC: 0 % (ref 0.0–0.2)

## 2018-10-26 LAB — C-REACTIVE PROTEIN: CRP: 1.1 mg/dL — ABNORMAL HIGH (ref <1.0)

## 2018-10-26 LAB — COMPREHENSIVE METABOLIC PANEL
ALT: 38 U/L (ref 0–44)
AST: 30 U/L (ref 15–41)
Albumin: 3.2 g/dL — ABNORMAL LOW (ref 3.5–5.0)
Alkaline Phosphatase: 63 U/L (ref 38–126)
Anion gap: 12 (ref 5–15)
BUN: 21 mg/dL — ABNORMAL HIGH (ref 6–20)
CO2: 31 mmol/L (ref 22–32)
Calcium: 8.6 mg/dL — ABNORMAL LOW (ref 8.9–10.3)
Chloride: 97 mmol/L — ABNORMAL LOW (ref 98–111)
Creatinine, Ser: 0.6 mg/dL — ABNORMAL LOW (ref 0.61–1.24)
GFR calc Af Amer: >60 mL/min (ref >60)
GFR calc non Af Amer: >60 mL/min (ref >60)
Glucose, Bld: 125 mg/dL — ABNORMAL HIGH (ref 70–99)
Potassium: 3.8 mmol/L (ref 3.5–5.1)
Sodium: 140 mmol/L (ref 135–145)
Total Bilirubin: 0.4 mg/dL (ref 0.3–1.2)
Total Protein: 6.9 g/dL (ref 6.5–8.1)

## 2018-10-26 LAB — MAGNESIUM: Magnesium: 1.7 mg/dL (ref 1.7–2.4)

## 2018-10-26 LAB — CK: Total CK: 38 U/L — ABNORMAL LOW (ref 49–397)

## 2018-10-26 LAB — D-DIMER, QUANTITATIVE: D-Dimer, Quant: 1.68 ug/mL-FEU — ABNORMAL HIGH (ref 0.00–0.50)

## 2018-10-26 LAB — HEMOGLOBIN A1C
Hgb A1c MFr Bld: 7.4 % — ABNORMAL HIGH (ref 4.8–5.6)
Mean Plasma Glucose: 165.68 mg/dL

## 2018-10-26 LAB — PHOSPHORUS: Phosphorus: 2.4 mg/dL — ABNORMAL LOW (ref 2.5–4.6)

## 2018-10-26 LAB — FERRITIN: Ferritin: 1008 ng/mL — ABNORMAL HIGH (ref 24–336)

## 2018-10-26 MED ORDER — DEXAMETHASONE 6 MG PO TABS
6.0000 mg | ORAL_TABLET | Freq: Every day | ORAL | Status: AC
  Administered 2018-10-26 – 2018-10-30 (×5): 6 mg via ORAL
  Filled 2018-10-26 (×5): qty 1

## 2018-10-26 NOTE — Progress Notes (Signed)
[facility] PROGRESS NOTE    Progress Note  Luke Phillips  ZOX:[id number] DOB: September 06, 1960 DOA: 10/21/2018 PCP: System, Provider Not In     Brief Narrative:   Luke AlexandersRandall Calamia is an 58 y.o. male past medical history of severe rheumatoid arthritis, bedbound on long term prednisone, COPD 4 L oxygen dependent, diastolic heart failure, and essential hypertension who was discovered to have SARS-CoV-2 4 days prior and outbreak at a nursing home facility which led everyone being tested and sent to the emergency room on 10/17/2018.  Patient was brought to the ED for fever unable to keep his oxygen saturations above 90% on 10 L high flow.  Started on dexamethasone and Remdesivir.  X-ray done on admission showed left lower lobe infiltrate.  And he was now giving Actemra as he had leukopenia and thrombocytopenia.  Assessment/Plan:   Acute on chronic respiratory failure with hypoxia and hypercapnia due to Pneumonia due to COVID-19 virus He completed his course of IV Remdesivir. Single dose of IV Actemra on 10/22/2018 Continue oral dexamethasone 6 mg daily. His procalcitonin was 0.25 we will discontinue IV empiric antibiotics. Patient has a baseline requirement of 4 L of oxygen at home, on admission he was 7 L high flow, we have been able to wean him down, currently today he is on 6 L high flow nasal cannula. He markers are significantly improved.  Anxiety and depression: Continue current home medications.  Chronic pain syndrome: Continue current home medications.  Diabetes mellitus type 2: A1c pending, he is currently on long-acting sliding scale insulin, his blood glucose has been erratic likely due to steroids. He is eaten about half of his food    Essential hypertension: Blood pressure currently stable.  Hypokalemia: Replete orally now resolved.  Rheumatoid arthritis: He is currently on steroids, was given Actemra on 10/22/2018.  DVT prophylaxis: lovenox Family Communication:none  Disposition Plan/Barrier to D/C: once back to home oxygen requirements Code Status:     Code Status Orders  (From admission, onward)         Start     Ordered   10/21/18 1429  Full code  Continuous     10/21/18 1428        Code Status History    This patient has a current code status but no historical code status.   Advance Care Planning Activity    Advance Directive Documentation     Most Recent Value  Type of Advance Directive  Out of facility DNR (pink MOST or yellow form)  Pre-existing out of facility DNR order (yellow form or pink MOST form)  Pink MOST form placed in chart (order not valid for inpatient use)  "MOST" Form in Place?  -        IV Access:    Peripheral IV   Procedures and diagnostic studies:   No results found.   Medical Consultants:    None.  Anti-Infectives:   None  Subjective:    Luke Phillips is his breathing is unchanged to still feel tired and fatigued.  Objective:    Vitals:   10/25/18 2300 10/26/18 0000 10/26/18 0100 10/26/18 0200  BP: 128/88 (!) 153/94 (!) 172/101 (!) 166/105  Pulse: 90 92 98 99  Resp: 16 17 (!) 21 16  Temp:      TempSrc:      SpO2: 94% 98% 95% 93%  Weight:      Height:       SpO2: 93 % O2 Flow Rate (L/min): 6 L/min  Intake/Output Summary (Last 24 hours) at 10/26/2018 0758 Last data filed at 10/26/2018 0500 Gross per 24 hour  Intake 580 ml  Output 1750 ml  Net -1170 ml   Filed Weights   10/21/18 0657  Weight: 79.4 kg    Exam: General exam: In no acute distress. Respiratory system: Good air movement and diffuse crackles bilaterally Cardiovascular system: S1 & S2 heard, RRR. No JVD. Gastrointestinal system: Abdomen is nondistended, soft and nontender.  Central nervous system: Alert and oriented. No focal neurological deficits. Extremities: No pedal edema. Skin: No rashes, lesions or ulcers Psychiatry: Judgement and insight appear normal. Mood & affect appropriate.    Data  Reviewed:    Labs: Basic Metabolic Panel: Recent Labs  Lab 10/21/18 0856 10/22/18 0400 10/23/18 0555 10/24/18 0615 10/25/18 0500  NA 135 140 144 142 140  K 2.9* 3.8 4.4 4.4 4.1  CL 98 103 105 100 97*  CO2 25 24 29  32 34*  GLUCOSE 113* 174* 157* 103* 89  BUN 13 15 15 15 19   CREATININE 0.67 0.49* 0.53* 0.58* 0.60*  CALCIUM 7.6* 8.5* 8.4* 8.6* 8.5*  MG  --  1.9 1.8 1.7 1.8  PHOS  --  2.3* 2.9 2.4* 2.8   GFR Estimated Creatinine Clearance: 111.7 mL/min (A) (by C-G formula based on SCr of 0.6 mg/dL (L)). Liver Function Tests: Recent Labs  Lab 10/21/18 0856 10/22/18 0400 10/23/18 0555 10/24/18 0615 10/25/18 0500  AST 31 30 31 28 23   ALT 26 29 31  32 32  ALKPHOS 55 67 60 59 62  BILITOT 0.6 0.4 0.1* 0.3 0.2*  PROT 5.7* 7.0 6.5 6.8 6.9  ALBUMIN 2.5* 2.7* 2.6* 2.8* 3.0*   No results for input(s): LIPASE, AMYLASE in the last 168 hours. No results for input(s): AMMONIA in the last 168 hours. Coagulation profile No results for input(s): INR, PROTIME in the last 168 hours. COVID-19 Labs  Recent Labs    10/24/18 0615 10/25/18 0430 10/25/18 0500  DDIMER 1.17*  --  1.22*  FERRITIN 1,469* 1,144*  --   CRP 4.1* 2.0*  --     Lab Results  Component Value Date   SARSCOV2NAA POSITIVE (A) 10/21/2018    CBC: Recent Labs  Lab 10/21/18 0856 10/22/18 0400 10/23/18 0555 10/24/18 0615 10/25/18 0500  WBC 3.4* 10.0 5.6 5.3 5.9  NEUTROABS 2.9 8.6* 4.3 3.8 4.0  HGB 20.3* 12.1* 11.8* 12.4* 12.5*  HCT 63.9* 38.7* 37.7* 40.6 41.3  MCV 88.0 88.2 89.5 89.8 88.8  PLT 64* 215 201 218 226   Cardiac Enzymes: Recent Labs  Lab 10/22/18 0400 10/23/18 0555 10/24/18 0615 10/25/18 0500  CKTOTAL 40* 64 35* 29*   BNP (last 3 results) No results for input(s): PROBNP in the last 8760 hours. CBG: Recent Labs  Lab 10/24/18 2135 10/25/18 0819 10/25/18 1134 10/25/18 1626 10/25/18 2050  GLUCAP 204* 94 218* 245* 221*   D-Dimer: Recent Labs    10/24/18 0615 10/25/18 0500   DDIMER 1.17* 1.22*   Hgb A1c: No results for input(s): HGBA1C in the last 72 hours. Lipid Profile: No results for input(s): CHOL, HDL, LDLCALC, TRIG, CHOLHDL, LDLDIRECT in the last 72 hours. Thyroid function studies: No results for input(s): TSH, T4TOTAL, T3FREE, THYROIDAB in the last 72 hours.  Invalid input(s): FREET3 Anemia work up: Recent Labs    10/24/18 0615 10/25/18 0430  FERRITIN 1,469* 1,144*   Sepsis Labs: Recent Labs  Lab 10/21/18 0856 10/21/18 1000 10/22/18 0400 10/23/18 0555 10/24/18 0615 10/25/18 0500  PROCALCITON  --  0.25  --  0.20  --   --   WBC 3.4*  --  10.0 5.6 5.3 5.9  LATICACIDVEN 1.1 0.9  --   --   --   --    Microbiology Recent Results (from the past 240 hour(s))  Culture, blood (routine x 2)     Status: None   Collection Time: 10/21/18  8:55 AM   Specimen: BLOOD  Result Value Ref Range Status   Specimen Description   Final    BLOOD PORTA CATH Performed at [facility] : [facility], [address] [place], [place] [address]    Special Requests   Final    BOTTLES DRAWN AEROBIC AND ANAEROBIC Blood Culture adequate volume Performed at [facility], [address] [place], [place] [address]    Culture   Final    NO GROWTH 5 DAYS Performed at [facility], [address] [place], [place] [address]    Report Status 10/26/2018 FINAL  Final  Culture, blood (routine x 2)     Status: None   Collection Time: 10/21/18  8:56 AM   Specimen: BLOOD  Result Value Ref Range Status   Specimen Description   Final    BLOOD PORTA CATH Performed at [facility], [address] [place], [place] [address]    Special Requests   Final    BOTTLES DRAWN AEROBIC AND ANAEROBIC Blood Culture adequate volume Performed at [facility], [address] [place], [place] [address]    Culture   Final    NO GROWTH 5 DAYS Performed at [facility], [address] [place], [place] [address]     Report Status 10/26/2018 FINAL  Final  SARS Coronavirus 2 (CEPHEID - Performed in [facility] hospital lab), Hosp Order     Status: Abnormal   Collection Time: 10/21/18  4:54 PM   Specimen: Nasopharyngeal Swab  Result Value Ref Range Status   SARS Coronavirus 2 POSITIVE (A) NEGATIVE Final    Comment: RESULT CALLED TO, READ BACK BY AND VERIFIED WITH: FRICKEY,J. RN @1920  ON 07.24.2020 BY COHEN,K (NOTE) If result is NEGATIVE SARS-CoV-2 target nucleic acids are NOT DETECTED. The SARS-CoV-2 RNA is generally detectable in upper and lower  respiratory specimens during the acute phase of infection. The lowest  concentration of SARS-CoV-2 viral copies this assay can detect is 250  copies / mL. A negative result does not preclude SARS-CoV-2 infection  and should not be used as the sole basis for treatment or other  patient management decisions.  A negative result may occur with  improper specimen collection / handling, submission of specimen other  than nasopharyngeal swab, presence of viral mutation(s) within the  areas targeted by this assay, and inadequate number of viral copies  (<250 copies / mL). A negative result must be combined with clinical  observations, patient history, and epidemiological information. If result is POSITIVE SARS-CoV-2 target nucleic acids are DET ECTED. The SARS-CoV-2 RNA is generally detectable in upper and lower  respiratory specimens during the acute phase of infection.  Positive  results are indicative of active infection with SARS-CoV-2.  Clinical  correlation with patient history and other diagnostic information is  necessary to determine patient infection status.  Positive results do  not rule out bacterial infection or co-infection with other viruses. If result is PRESUMPTIVE POSTIVE SARS-CoV-2 nucleic acids MAY BE PRESENT.   A presumptive positive result was obtained on the submitted specimen  and  confirmed on repeat testing.  While 2019 novel  coronavirus  (SARS-CoV-2) nucleic acids may be present in the submitted sample  additional confirmatory testing may be necessary for epidemiological  and / or clinical management purposes  to differentiate between  SARS-CoV-2 and other Sarbecovirus currently known to infect humans.  If clinically indicated additional testing with an alternate test  methodology ([facility]) is advised. The SARS-CoV-2 RNA is generally  detectable in upper and lower respiratory specimens during the acute  phase of infection. The expected result is Negative. Fact Sheet for Patients:  StrictlyIdeas.no Fact Sheet for Healthcare Providers: BankingDealers.co.za This test is not yet approved or cleared by the [place] FDA and has been authorized for detection and/or diagnosis of SARS-CoV-2 by FDA under an Emergency Use Authorization (EUA).  This EUA will remain in effect (meaning this test can be used) for the duration of the COVID-19 declaration under Section 564(b)(1) of the Act, 21 U.S.C. section 360bbb-3(b)(1), unless the authorization is terminated or revoked sooner. Performed at [facility], [place] [address], [place], [place] [address]      Medications:   . aspirin  325 mg Oral Daily  . atorvastatin  40 mg Oral Daily  . baclofen  5 mg Oral TID  . carvedilol  6.25 mg Oral BID WC  . dexamethasone (DECADRON) injection  10 mg Intravenous Q24H  . enoxaparin (LOVENOX) injection  40 mg Subcutaneous Q24H  . furosemide  20 mg Oral Daily  . gabapentin  1,600 mg Oral QHS  . gabapentin  800 mg Oral BID  . insulin aspart  0-5 Units Subcutaneous QHS  . insulin aspart  0-9 Units Subcutaneous TID WC  . insulin glargine  10 Units Subcutaneous QHS  . lactulose  20 g Oral BID  . levETIRAcetam  1,000 mg Oral TID  . lidocaine  2 patch Transdermal Q24H  . morphine  60 mg Oral Q8H  . polyethylene glycol  17 g Oral Daily  . potassium chloride  40  mEq Oral BID  . sertraline  100 mg Oral Daily  . sodium chloride flush  3 mL Intravenous Q12H  . venlafaxine  75 mg Oral TID WC   Continuous Infusions: . sodium chloride 10 mL/hr at 10/25/18 0600  . sodium chloride        LOS: 5 days   Charlynne Cousins  [facility]  10/26/2018, 7:58 AM

## 2018-10-26 NOTE — Progress Notes (Signed)
PT Cancellation Note  Patient Details Name: Luke Phillips MRN: [id number] DOB: [birth year]   Cancelled Treatment:    Reason Eval/Treat Not Completed: PT screened, no needs identified, will sign off, noted patient is from SNF from [place] then to [facility] due to covid. Chart indicates pt. Is bedbound. Noted in bed with significant joint deformities.    Claretha Cooper 10/26/2018, 1:07 PM  [place]  Office [phone]

## 2018-10-27 LAB — CBC WITH DIFFERENTIAL/PLATELET
Abs Immature Granulocytes: 0.08 10*3/uL — ABNORMAL HIGH (ref 0.00–0.07)
Basophils Absolute: 0 10*3/uL (ref 0.0–0.1)
Basophils Relative: 0 %
Eosinophils Absolute: 0 10*3/uL (ref 0.0–0.5)
Eosinophils Relative: 0 %
HCT: 43.7 % (ref 39.0–52.0)
Hemoglobin: 13.3 g/dL (ref 13.0–17.0)
Immature Granulocytes: 1 %
Lymphocytes Relative: 32 %
Lymphs Abs: 2.3 10*3/uL (ref 0.7–4.0)
MCH: 27 pg (ref 26.0–34.0)
MCHC: 30.4 g/dL (ref 30.0–36.0)
MCV: 88.8 fL (ref 80.0–100.0)
Monocytes Absolute: 0.8 10*3/uL (ref 0.1–1.0)
Monocytes Relative: 11 %
Neutro Abs: 4.1 10*3/uL (ref 1.7–7.7)
Neutrophils Relative %: 56 %
Platelets: 244 10*3/uL (ref 150–400)
RBC: 4.92 MIL/uL (ref 4.22–5.81)
RDW: 14.6 % (ref 11.5–15.5)
WBC: 7.4 10*3/uL (ref 4.0–10.5)
nRBC: 0 % (ref 0.0–0.2)

## 2018-10-27 LAB — FERRITIN: Ferritin: 1069 ng/mL — ABNORMAL HIGH (ref 24–336)

## 2018-10-27 LAB — COMPREHENSIVE METABOLIC PANEL
ALT: 45 U/L — ABNORMAL HIGH (ref 0–44)
AST: 29 U/L (ref 15–41)
Albumin: 3.3 g/dL — ABNORMAL LOW (ref 3.5–5.0)
Alkaline Phosphatase: 69 U/L (ref 38–126)
Anion gap: 9 (ref 5–15)
BUN: 20 mg/dL (ref 6–20)
CO2: 34 mmol/L — ABNORMAL HIGH (ref 22–32)
Calcium: 8.8 mg/dL — ABNORMAL LOW (ref 8.9–10.3)
Chloride: 99 mmol/L (ref 98–111)
Creatinine, Ser: 0.6 mg/dL — ABNORMAL LOW (ref 0.61–1.24)
GFR calc Af Amer: >60 mL/min (ref >60)
GFR calc non Af Amer: >60 mL/min (ref >60)
Glucose, Bld: 125 mg/dL — ABNORMAL HIGH (ref 70–99)
Potassium: 4 mmol/L (ref 3.5–5.1)
Sodium: 142 mmol/L (ref 135–145)
Total Bilirubin: 0.4 mg/dL (ref 0.3–1.2)
Total Protein: 7 g/dL (ref 6.5–8.1)

## 2018-10-27 LAB — CK: Total CK: 33 U/L — ABNORMAL LOW (ref 49–397)

## 2018-10-27 LAB — GLUCOSE, CAPILLARY
Glucose-Capillary: 132 mg/dL — ABNORMAL HIGH (ref 70–99)
Glucose-Capillary: 181 mg/dL — ABNORMAL HIGH (ref 70–99)
Glucose-Capillary: 265 mg/dL — ABNORMAL HIGH (ref 70–99)
Glucose-Capillary: 195 mg/dL — ABNORMAL HIGH (ref 70–99)

## 2018-10-27 LAB — MAGNESIUM: Magnesium: 2 mg/dL (ref 1.7–2.4)

## 2018-10-27 LAB — D-DIMER, QUANTITATIVE: D-Dimer, Quant: 1.81 ug/mL-FEU — ABNORMAL HIGH (ref 0.00–0.50)

## 2018-10-27 LAB — C-REACTIVE PROTEIN: CRP: 0.9 mg/dL (ref <1.0)

## 2018-10-27 LAB — PHOSPHORUS: Phosphorus: 2.6 mg/dL (ref 2.5–4.6)

## 2018-10-27 MED ORDER — BACLOFEN 5 MG HALF TABLET
5.0000 mg | ORAL_TABLET | Freq: Three times a day (TID) | ORAL | Status: DC
  Administered 2018-10-27 – 2018-11-01 (×15): 5 mg via ORAL
  Filled 2018-10-27 (×19): qty 1

## 2018-10-27 NOTE — Progress Notes (Signed)
Inpatient Diabetes Program Recommendations  AACE/ADA: New Consensus Statement on Inpatient Glycemic Control (2015)  Target Ranges:  Prepandial:   less than 140 mg/dL      Peak postprandial:   less than 180 mg/dL (1-2 hours)      Critically ill patients:  140 - 180 mg/dL   Results for Luke Phillips, Luke Phillips (MRN [id number]) as of 10/27/2018 11:24  Ref. Range 10/26/2018 08:02 10/26/2018 12:02 10/26/2018 17:02 10/26/2018 21:48 10/27/2018 07:55  Glucose-Capillary Latest Ref Range: 70 - 99 mg/dL 108 (H) 183 (H) 301 (H) 149 (H) 132 (H)   Review of Glycemic Control  Diabetes history: DM 2 Outpatient Diabetes medications: Lantus 5 units, Humalog 4-12 units 4x/day Current orders for Inpatient glycemic control: Lantus 10 units Novolog 0-9 units tid + Novolog 0-5 units qhs  A1c 7.4% on 7/29  Inpatient Diabetes Program Recommendations:    Decadron 6 mg Daily given in am. Postprandial glucose increases throughout the day. Could consider Novolog meal coverage when patient is consuming at least 50% of meals.  Thanks, Tama Headings RN, MSN, BC-ADM Inpatient Diabetes Coordinator Team Pager [phone] (8a-5p)

## 2018-10-27 NOTE — Progress Notes (Signed)
[facility] PROGRESS NOTE    Progress Note  Luke Phillips  QIO:[id number] DOB: 1960-09-04 DOA: 10/21/2018 PCP: System, Provider Not In     Brief Narrative:   Luke Phillips is an 58 y.o. male past medical history of severe rheumatoid arthritis, bedbound on long term prednisone, COPD 4 L oxygen dependent, diastolic heart failure, and essential hypertension who was discovered to have SARS-CoV-2 4 days prior and outbreak at a nursing home facility which led everyone being tested and sent to the emergency room on 10/17/2018.  Patient was brought to the ED for fever unable to keep his oxygen saturations above 90% on 10 L high flow.  Started on dexamethasone and Remdesivir.  X-ray done on admission showed left lower lobe infiltrate.  And he was now giving Actemra as he had leukopenia and thrombocytopenia.  Assessment/Plan:   Acute on chronic respiratory failure with hypoxia and hypercapnia due to Pneumonia due to COVID-19 virus He completed his course of IV Remdesivir. Single dose of IV Actemra on 10/22/2018 Continue oral dexamethasone 6 mg daily. Patient was increased to 12 L high flow nasal cannula overnight.  He has remained afebrile no leukocytosis no new symptoms of cough or shortness of breath. Inflammatory markers continue to improve. Due to is more multiple comorbidities and poor protoplasm he will be slow to improve from a pulmonary standpoint.  Anxiety and depression: Continue current home medications.  Chronic pain syndrome: Continue current home medications.  Diabetes mellitus type 2: A1c is 7.4 he is currently on long-acting sliding scale insulin, his blood glucose has been erratic likely due to steroids. He is eaten about half of his food.    Essential hypertension: Blood pressure currently stable.  Hypokalemia: Replete orally now resolved.  Rheumatoid arthritis: He is currently on steroids, was given Actemra on 10/22/2018.  DVT prophylaxis: lovenox Family  Communication:none Disposition Plan/Barrier to D/C: once back to 4 L of oxygen. Code Status:     Code Status Orders  (From admission, onward)         Start     Ordered   10/21/18 1429  Full code  Continuous     10/21/18 1428        Code Status History    This patient has a current code status but no historical code status.   Advance Care Planning Activity    Advance Directive Documentation     Most Recent Value  Type of Advance Directive  Out of facility DNR (pink MOST or yellow form)  Pre-existing out of facility DNR order (yellow form or pink MOST form)  Pink MOST form placed in chart (order not valid for inpatient use)  "MOST" Form in Place?  -        IV Access:    Peripheral IV   Procedures and diagnostic studies:   No results found.   Medical Consultants:    None.  Anti-Infectives:   None  Subjective:    Luke Phillips no new complaints, he relates his breathing is unchanged compared to yesterday.  Objective:    Vitals:   10/27/18 0400 10/27/18 0401 10/27/18 0402 10/27/18 0452  BP: 132/86     Pulse: 96 97 97   Resp: 10 14 11    Temp:    97.6 F (36.4 C)  TempSrc:    Oral  SpO2: 94% 96% 95%   Weight:      Height:       SpO2: 95 % O2 Flow Rate (L/min): 12 L/min   Intake/Output  Summary (Last 24 hours) at 10/27/2018 0821 Last data filed at 10/26/2018 1810 Gross per 24 hour  Intake 120 ml  Output 1200 ml  Net -1080 ml   Filed Weights   10/21/18 0657  Weight: 79.4 kg    Exam: General exam: In no acute distress. Respiratory system: Good air movement and diffuse crackles bilaterally Cardiovascular system: S1 & S2 heard, RRR. No JVD. Gastrointestinal system: Abdomen is nondistended, soft and nontender.  Central nervous system: Alert and oriented. No focal neurological deficits. Extremities: No pedal edema. Skin: No rashes, lesions or ulcers Psychiatry: Judgement and insight appear normal. Mood & affect appropriate.    Data  Reviewed:    Labs: Basic Metabolic Panel: Recent Labs  Lab 10/23/18 0555 10/24/18 0615 10/25/18 0500 10/26/18 0844 10/27/18 0557  NA 144 142 140 140 142  K 4.4 4.4 4.1 3.8 4.0  CL 105 100 97* 97* 99  CO2 29 32 34* 31 34*  GLUCOSE 157* 103* 89 125* 125*  BUN 15 15 19  21* 20  CREATININE 0.53* 0.58* 0.60* 0.60* 0.60*  CALCIUM 8.4* 8.6* 8.5* 8.6* 8.8*  MG 1.8 1.7 1.8 1.7 2.0  PHOS 2.9 2.4* 2.8 2.4* 2.6   GFR Estimated Creatinine Clearance: 111.7 mL/min (A) (by C-G formula based on SCr of 0.6 mg/dL (L)). Liver Function Tests: Recent Labs  Lab 10/23/18 0555 10/24/18 0615 10/25/18 0500 10/26/18 0844 10/27/18 0557  AST 31 28 23 30 29   ALT 31 32 32 38 45*  ALKPHOS 60 59 62 63 69  BILITOT 0.1* 0.3 0.2* 0.4 0.4  PROT 6.5 6.8 6.9 6.9 7.0  ALBUMIN 2.6* 2.8* 3.0* 3.2* 3.3*   No results for input(s): LIPASE, AMYLASE in the last 168 hours. No results for input(s): AMMONIA in the last 168 hours. Coagulation profile No results for input(s): INR, PROTIME in the last 168 hours. COVID-19 Labs  Recent Labs    10/25/18 0430 10/25/18 0500 10/26/18 0844 10/27/18 0557  DDIMER  --  1.22* 1.68* 1.81*  FERRITIN 1,144*  --  1,008*  --   CRP 2.0*  --  1.1*  --     Lab Results  Component Value Date   SARSCOV2NAA POSITIVE (A) 10/21/2018    CBC: Recent Labs  Lab 10/23/18 0555 10/24/18 0615 10/25/18 0500 10/26/18 0844 10/27/18 0557  WBC 5.6 5.3 5.9 7.3 7.4  NEUTROABS 4.3 3.8 4.0 4.3 4.1  HGB 11.8* 12.4* 12.5* 13.2 13.3  HCT 37.7* 40.6 41.3 42.3 43.7  MCV 89.5 89.8 88.8 87.9 88.8  PLT 201 218 226 234 244   Cardiac Enzymes: Recent Labs  Lab 10/23/18 0555 10/24/18 0615 10/25/18 0500 10/26/18 0844 10/27/18 0557  CKTOTAL 64 35* 29* 38* 33*   BNP (last 3 results) No results for input(s): PROBNP in the last 8760 hours. CBG: Recent Labs  Lab 10/26/18 0802 10/26/18 1202 10/26/18 1702 10/26/18 2148 10/27/18 0755  GLUCAP 108* 183* 301* 149* 132*   D-Dimer: Recent  Labs    10/26/18 0844 10/27/18 0557  DDIMER 1.68* 1.81*   Hgb A1c: Recent Labs    10/26/18 0844  HGBA1C 7.4*   Lipid Profile: No results for input(s): CHOL, HDL, LDLCALC, TRIG, CHOLHDL, LDLDIRECT in the last 72 hours. Thyroid function studies: No results for input(s): TSH, T4TOTAL, T3FREE, THYROIDAB in the last 72 hours.  Invalid input(s): FREET3 Anemia work up: Recent Labs    10/25/18 0430 10/26/18 0844  FERRITIN 1,144* 1,008*   Sepsis Labs: Recent Labs  Lab 10/21/18 0856 10/21/18 1000  10/23/18 0555 10/24/18 0615 10/25/18 0500 10/26/18 0844 10/27/18 0557  PROCALCITON  --  0.25  --  0.20  --   --   --   --   WBC 3.4*  --    < > 5.6 5.3 5.9 7.3 7.4  LATICACIDVEN 1.1 0.9  --   --   --   --   --   --    < > = values in this interval not displayed.   Microbiology Recent Results (from the past 240 hour(s))  Culture, blood (routine x 2)     Status: None   Collection Time: 10/21/18  8:55 AM   Specimen: BLOOD  Result Value Ref Range Status   Specimen Description   Final    BLOOD PORTA CATH Performed at [facility], [address] [place], [place] [address]    Special Requests   Final    BOTTLES DRAWN AEROBIC AND ANAEROBIC Blood Culture adequate volume Performed at [facility], [address] [place], [place] [address]    Culture   Final    NO GROWTH 5 DAYS Performed at [facility], [address] [place], [place] [address]    Report Status 10/26/2018 FINAL  Final  Culture, blood (routine x 2)     Status: None   Collection Time: 10/21/18  8:56 AM   Specimen: BLOOD  Result Value Ref Range Status   Specimen Description   Final    BLOOD PORTA CATH Performed at [facility], [address] [place], [place] [address]    Special Requests   Final    BOTTLES DRAWN AEROBIC AND ANAEROBIC Blood Culture adequate volume Performed at [facility], [address] [place],  [place] [address]    Culture   Final    NO GROWTH 5 DAYS Performed at [facility], [address] [place], [place] [address]    Report Status 10/26/2018 FINAL  Final  SARS Coronavirus 2 (CEPHEID - Performed in [facility] hospital lab), Hosp Order     Status: Abnormal   Collection Time: 10/21/18  4:54 PM   Specimen: Nasopharyngeal Swab  Result Value Ref Range Status   SARS Coronavirus 2 POSITIVE (A) NEGATIVE Final    Comment: RESULT CALLED TO, READ BACK BY AND VERIFIED WITH: FRICKEY,J. RN @1920  ON 07.24.2020 BY COHEN,K (NOTE) If result is NEGATIVE SARS-CoV-2 target nucleic acids are NOT DETECTED. The SARS-CoV-2 RNA is generally detectable in upper and lower  respiratory specimens during the acute phase of infection. The lowest  concentration of SARS-CoV-2 viral copies this assay can detect is 250  copies / mL. A negative result does not preclude SARS-CoV-2 infection  and should not be used as the sole basis for treatment or other  patient management decisions.  A negative result may occur with  improper specimen collection / handling, submission of specimen other  than nasopharyngeal swab, presence of viral mutation(s) within the  areas targeted by this assay, and inadequate number of viral copies  (<250 copies / mL). A negative result must be combined with clinical  observations, patient history, and epidemiological information. If result is POSITIVE SARS-CoV-2 target nucleic acids are DET ECTED. The SARS-CoV-2 RNA is generally detectable in upper and lower  respiratory specimens during the acute phase of infection.  Positive  results are indicative of active infection with SARS-CoV-2.  Clinical  correlation with patient history and other diagnostic information is  necessary to determine  patient infection status.  Positive results do  not rule out bacterial infection or co-infection with other viruses. If result is PRESUMPTIVE POSTIVE SARS-CoV-2 nucleic acids MAY BE PRESENT.    A presumptive positive result was obtained on the submitted specimen  and confirmed on repeat testing.  While 2019 novel coronavirus  (SARS-CoV-2) nucleic acids may be present in the submitted sample  additional confirmatory testing may be necessary for epidemiological  and / or clinical management purposes  to differentiate between  SARS-CoV-2 and other Sarbecovirus currently known to infect humans.  If clinically indicated additional testing with an alternate test  methodology (LA [phone]) is advised. The SARS-CoV-2 RNA is generally  detectable in upper and lower respiratory specimens during the acute  phase of infection. The expected result is Negative. Fact Sheet for Patients:  BoilerBrush.com.cy Fact Sheet for Healthcare Providers: https://pope.com/ This test is not yet approved or cleared by the [place] FDA and has been authorized for detection and/or diagnosis of SARS-CoV-2 by FDA under an Emergency Use Authorization (EUA).  This EUA will remain in effect (meaning this test can be used) for the duration of the COVID-19 declaration under Section 564(b)(1) of the Act, 21 U.S.C. section 360bbb-3(b)(1), unless the authorization is terminated or revoked sooner. Performed at [facility], [address] [place], [place] [address]      Medications:   . aspirin  325 mg Oral Daily  . atorvastatin  40 mg Oral Daily  . baclofen  5 mg Oral TID  . carvedilol  6.25 mg Oral BID WC  . dexamethasone  6 mg Oral Q breakfast  . enoxaparin (LOVENOX) injection  40 mg Subcutaneous Q24H  . furosemide  20 mg Oral Daily  . gabapentin  1,600 mg Oral QHS  . gabapentin  800 mg Oral BID  . insulin aspart  0-5 Units Subcutaneous QHS  . insulin aspart  0-9 Units Subcutaneous TID WC  . insulin glargine  10 Units Subcutaneous QHS  . lactulose  20 g Oral BID  . levETIRAcetam  1,000 mg Oral TID  . lidocaine  2 patch Transdermal  Q24H  . morphine  60 mg Oral Q8H  . polyethylene glycol  17 g Oral Daily  . potassium chloride  40 mEq Oral BID  . sertraline  100 mg Oral Daily  . sodium chloride flush  3 mL Intravenous Q12H  . venlafaxine  75 mg Oral TID WC   Continuous Infusions: . sodium chloride        LOS: 6 days   Marinda Elk  [facility]  10/27/2018, 8:21 AM

## 2018-10-27 NOTE — TOC Initial Note (Signed)
Transition of Care [facility]) - Initial/Assessment Note    Patient Details  Name: Luke Phillips MRN: [id number] Date of Birth: Dec 13, 1960  [facility] CM/SW Contact:    Alberteen Sam, [place] Phone Number: [phone] 10/27/2018, 11:35 AM  Clinical Narrative:                  CSW consulted with patient's wife Daphine regarding discharge planning when patient medically stable. She reports patient is originally from [facility] and has been there for 6 months before testing COVID + and being transferred to [facility], She reports patient was at [facility] for less than a week before coming to the hospital. She states her and patient's sister have done their research and would like to look at other options for patient's discharge until he tests negative and can go back to [facility]. CSW informed Daphine of [facility] and [facility] taking COVID patients, she reports she will do her research on them and discuss these options with family. Daphine will call CSW back with choice.   Expected Discharge Plan: Skilled Nursing Facility Barriers to Discharge: Continued Medical Work up   Patient Goals and CMS Choice   CMS Medicare.gov Compare Post Acute Care list provided to:: Patient Represenative (must comment)(spouse Daphine) Choice offered to / list presented to : Spouse(spouse Daphine)  Expected Discharge Plan and Services Expected Discharge Plan: [place] Acute Care Choice: [place] Living arrangements for the past 2 months: Skilled Nursing Facility(from [facility])                                      Prior Living Arrangements/Services Living arrangements for the past 2 months: Skilled Nursing Facility(from [facility]) Lives with:: Self Patient language and need for interpreter reviewed:: Yes Do you feel safe going back to the place where you live?: Yes      Need for Family Participation in Patient Care: Yes  (Comment) Care giver support system in place?: Yes (comment)   Criminal Activity/Legal Involvement Pertinent to Current Situation/Hospitalization: No - Comment as needed  Activities of Daily Living Home Assistive Devices/Equipment: Wheelchair ADL Screening (condition at time of admission) Patient's cognitive ability adequate to safely complete daily activities?: Yes Is the patient deaf or have difficulty hearing?: No Does the patient have difficulty seeing, even when wearing glasses/contacts?: No Does the patient have difficulty concentrating, remembering, or making decisions?: No Patient able to express need for assistance with ADLs?: Yes Does the patient have difficulty dressing or bathing?: Yes Independently performs ADLs?: No Does the patient have difficulty walking or climbing stairs?: Yes Weakness of Legs: Both Weakness of Arms/Hands: Both  Permission Sought/Granted Permission sought to share information with : Case Manager, Family Supports, Chartered certified accountant granted to share information with : Yes, Verbal Permission Granted  Share Information with NAME: Daphine  Permission granted to share info w AGENCY: SNFs  Permission granted to share info w Relationship: spouse  Permission granted to share info w Contact Information: [phone]  Emotional Assessment Appearance:: Other (Comment Required(unable to assess - remote) Attitude/Demeanor/Rapport: Unable to Assess Affect (typically observed): Unable to Assess Orientation: : Oriented to Self, Oriented to Place, Oriented to  Time, Oriented to Situation Alcohol / Substance Use: Not Applicable Psych Involvement: No (comment)  Admission diagnosis:  Shortness of breath [R06.02] Hypoxia [R09.02] COVID-19 virus infection [U07.1] Acute respiratory disease  due to COVID-19 virus [U07.1, J06.9] Patient Active Problem List   Diagnosis Date Noted  . Pneumonia due to COVID-19 virus 10/21/2018  . Acute  respiratory disease due to COVID-19 virus 10/21/2018  . Dysphasia 07/08/2018  . Diabetes mellitus type 2 in obese (HCC) 07/07/2018  . Dyslipidemia 07/07/2018  . 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 Impaired functional mobility, balance, gait, and endurance 07/07/2018  . Panlobular emphysema (HCC) 07/07/2018  . Seizure disorder (HCC) 07/07/2018  . Physical deconditioning 01/06/2018  . Chronic diastolic heart failure (HCC) 08/18/2017  . BPH (benign prostatic hyperplasia) 05/13/2017  . Subcapsular hemorrhage of spleen 05/13/2017  . Pulmonary nodule 05/02/2017  . Anxiety and depression 04/20/2017  . Hypertension 04/20/2017  . Chronic pain syndrome 03/09/2017  . Compression fracture of thoracic vertebra (HCC) 03/09/2017  . Epigastric pain 02/19/2017  . Normocytic anemia 01/06/2017  . Complicated urinary tract infection 01/05/2017  . COPD (chronic obstructive pulmonary disease) (HCC) 11/16/2016  . Hypoxia 11/16/2016  . Sacral ulcer (HCC) 11/16/2016  . Hydronephrosis 11/15/2016  . Calculus of kidney 10/26/2016  . Hypokalemia 04/28/2016  . Acute delirium 04/19/2016  . Acute on chronic respiratory failure with hypoxia and hypercapnia (HCC) 04/19/2016  . OSA (obstructive sleep apnea) 04/19/2016  . Pneumonia, unspecified organism 04/19/2016  . Sepsis (HCC) 04/19/2016  . Rheumatoid arthritis involving multiple sites (HCC) 05/04/2012  . Non-toxic multinodular goiter 03/06/2012  . Tobacco use 03/06/2012  . Cervical myelopathy (HCC) 10/22/2011   PCP:  System, Provider Not In Pharmacy:  No Pharmacies Listed    Social Determinants of Health (SDOH) Interventions    Readmission Risk Interventions No flowsheet data found.

## 2018-10-28 ENCOUNTER — Inpatient Hospital Stay (HOSPITAL_COMMUNITY): Payer: Medicare Other

## 2018-10-28 LAB — COMPREHENSIVE METABOLIC PANEL
ALT: 47 U/L — ABNORMAL HIGH (ref 0–44)
AST: 25 U/L (ref 15–41)
Albumin: 3.6 g/dL (ref 3.5–5.0)
Alkaline Phosphatase: 77 U/L (ref 38–126)
Anion gap: 9 (ref 5–15)
BUN: 21 mg/dL — ABNORMAL HIGH (ref 6–20)
CO2: 33 mmol/L — ABNORMAL HIGH (ref 22–32)
Calcium: 8.9 mg/dL (ref 8.9–10.3)
Chloride: 99 mmol/L (ref 98–111)
Creatinine, Ser: 0.67 mg/dL (ref 0.61–1.24)
GFR calc Af Amer: >60 mL/min (ref >60)
GFR calc non Af Amer: >60 mL/min (ref >60)
Glucose, Bld: 168 mg/dL — ABNORMAL HIGH (ref 70–99)
Potassium: 4.3 mmol/L (ref 3.5–5.1)
Sodium: 141 mmol/L (ref 135–145)
Total Bilirubin: 0.4 mg/dL (ref 0.3–1.2)
Total Protein: 7.3 g/dL (ref 6.5–8.1)

## 2018-10-28 LAB — CBC WITH DIFFERENTIAL/PLATELET
Abs Immature Granulocytes: 0.13 10*3/uL — ABNORMAL HIGH (ref 0.00–0.07)
Basophils Absolute: 0.1 10*3/uL (ref 0.0–0.1)
Basophils Relative: 1 %
Eosinophils Absolute: 0.1 10*3/uL (ref 0.0–0.5)
Eosinophils Relative: 1 %
HCT: 47 % (ref 39.0–52.0)
Hemoglobin: 14.5 g/dL (ref 13.0–17.0)
Immature Granulocytes: 1 %
Lymphocytes Relative: 31 %
Lymphs Abs: 3 10*3/uL (ref 0.7–4.0)
MCH: 27.7 pg (ref 26.0–34.0)
MCHC: 30.9 g/dL (ref 30.0–36.0)
MCV: 89.7 fL (ref 80.0–100.0)
Monocytes Absolute: 1.1 10*3/uL — ABNORMAL HIGH (ref 0.1–1.0)
Monocytes Relative: 11 %
Neutro Abs: 5.4 10*3/uL (ref 1.7–7.7)
Neutrophils Relative %: 55 %
Platelets: 279 10*3/uL (ref 150–400)
RBC: 5.24 MIL/uL (ref 4.22–5.81)
RDW: 15.1 % (ref 11.5–15.5)
WBC: 9.7 10*3/uL (ref 4.0–10.5)
nRBC: 0.6 % — ABNORMAL HIGH (ref 0.0–0.2)

## 2018-10-28 LAB — GLUCOSE, CAPILLARY
Glucose-Capillary: 146 mg/dL — ABNORMAL HIGH (ref 70–99)
Glucose-Capillary: 214 mg/dL — ABNORMAL HIGH (ref 70–99)
Glucose-Capillary: 186 mg/dL — ABNORMAL HIGH (ref 70–99)
Glucose-Capillary: 133 mg/dL — ABNORMAL HIGH (ref 70–99)

## 2018-10-28 LAB — STREP PNEUMONIAE URINARY ANTIGEN: Strep Pneumo Urinary Antigen: NEGATIVE

## 2018-10-28 LAB — FERRITIN: Ferritin: 1081 ng/mL — ABNORMAL HIGH (ref 24–336)

## 2018-10-28 LAB — MAGNESIUM: Magnesium: 2.1 mg/dL (ref 1.7–2.4)

## 2018-10-28 LAB — CK: Total CK: 43 U/L — ABNORMAL LOW (ref 49–397)

## 2018-10-28 LAB — EXPECTORATED SPUTUM ASSESSMENT W GRAM STAIN, RFLX TO RESP C

## 2018-10-28 LAB — D-DIMER, QUANTITATIVE: D-Dimer, Quant: 2.51 ug/mL-FEU — ABNORMAL HIGH (ref 0.00–0.50)

## 2018-10-28 LAB — C-REACTIVE PROTEIN: CRP: 1 mg/dL — ABNORMAL HIGH (ref <1.0)

## 2018-10-28 LAB — PHOSPHORUS: Phosphorus: 3.1 mg/dL (ref 2.5–4.6)

## 2018-10-28 MED ORDER — ALTEPLASE 2 MG IJ SOLR
2.0000 mg | Freq: Once | INTRAMUSCULAR | Status: AC
  Administered 2018-10-28: 2 mg
  Filled 2018-10-28: qty 2

## 2018-10-28 MED ORDER — SODIUM CHLORIDE 0.9 % IV SOLN
500.0000 mg | INTRAVENOUS | Status: DC
  Administered 2018-10-28 – 2018-10-31 (×4): 500 mg via INTRAVENOUS
  Filled 2018-10-28 (×4): qty 500

## 2018-10-28 MED ORDER — SODIUM CHLORIDE 0.9 % IV SOLN
2.0000 g | INTRAVENOUS | Status: DC
  Administered 2018-10-28 – 2018-10-31 (×4): 2 g via INTRAVENOUS
  Filled 2018-10-28 (×4): qty 20

## 2018-10-28 MED ORDER — CARVEDILOL 12.5 MG PO TABS
12.5000 mg | ORAL_TABLET | Freq: Two times a day (BID) | ORAL | Status: DC
  Administered 2018-10-28 – 2018-10-29 (×2): 12.5 mg via ORAL
  Filled 2018-10-28 (×3): qty 1

## 2018-10-28 MED ORDER — INSULIN ASPART 100 UNIT/ML ~~LOC~~ SOLN
10.0000 [IU] | Freq: Three times a day (TID) | SUBCUTANEOUS | Status: DC
  Administered 2018-10-28 – 2018-10-30 (×8): 10 [IU] via SUBCUTANEOUS

## 2018-10-28 MED ORDER — INSULIN ASPART 100 UNIT/ML ~~LOC~~ SOLN: 6.0000 [IU] | Freq: Three times a day (TID) | SUBCUTANEOUS | Status: DC

## 2018-10-28 MED ORDER — INSULIN ASPART 100 UNIT/ML ~~LOC~~ SOLN
0.0000 [IU] | Freq: Three times a day (TID) | SUBCUTANEOUS | Status: DC
  Administered 2018-10-28: 7 [IU] via SUBCUTANEOUS
  Administered 2018-10-28: 4 [IU] via SUBCUTANEOUS
  Administered 2018-10-29: 3 [IU] via SUBCUTANEOUS
  Administered 2018-10-29: 4 [IU] via SUBCUTANEOUS
  Administered 2018-10-30: 18:00:00 7 [IU] via SUBCUTANEOUS
  Administered 2018-10-30 – 2018-10-31 (×2): 4 [IU] via SUBCUTANEOUS

## 2018-10-28 MED ORDER — INSULIN ASPART 100 UNIT/ML ~~LOC~~ SOLN: 0.0000 [IU] | Freq: Every day | SUBCUTANEOUS | Status: DC

## 2018-10-28 MED ORDER — SODIUM CHLORIDE 0.9% FLUSH
10.0000 mL | INTRAVENOUS | Status: DC | PRN
  Administered 2018-10-30: 10 mL
  Filled 2018-10-28: qty 40

## 2018-10-28 MED ORDER — SODIUM CHLORIDE 0.9% FLUSH
10.0000 mL | Freq: Two times a day (BID) | INTRAVENOUS | Status: DC
  Administered 2018-10-28 – 2018-10-31 (×7): 10 mL
  Administered 2018-11-01: 20 mL

## 2018-10-28 NOTE — Plan of Care (Signed)

## 2018-10-28 NOTE — Progress Notes (Signed)
RN offered to call patients wife with clinical updates. Patient stating he is in constant communication with his spouse and she is aware of his health status. He declines outreach by RN at this time

## 2018-10-28 NOTE — Progress Notes (Signed)
[facility] PROGRESS NOTE    Progress Note  Luke Phillips  RWE:[id number] DOB: Sep 09, 1960 DOA: 10/21/2018 PCP: System, Provider Not In     Brief Narrative:   Luke Phillips is an 58 y.o. male past medical history of severe rheumatoid arthritis, bedbound on long term prednisone, COPD 4 L oxygen dependent, diastolic heart failure, and essential hypertension who was discovered to have SARS-CoV-2 4 days prior and outbreak at a nursing home facility which led everyone being tested and sent to the emergency room on 10/17/2018.  Patient was brought to the ED for fever unable to keep his oxygen saturations above 90% on 10 L high flow.  Started on dexamethasone and Remdesivir.  X-ray done on admission showed left lower lobe infiltrate.  And he was now giving Actemra as he had leukopenia and thrombocytopenia.  Assessment/Plan:   Acute on chronic respiratory failure with hypoxia and hypercapnia due to Pneumonia due to COVID-19 virus; He has multiple risk factors for acute decompensation and multiorgan failure. He completed his course of IV Remdesivir. Single dose of IV Actemra on 10/22/2018 Continue oral dexamethasone 6 mg daily. He has remained afebrile, despite this his oxygen needs continue to worsen today he is on 12 L high flow nasal cannula Inflammatory markers continue to improve. He continues to deteriorate very slowly his prognosis is guarded.    New purulent sputum production: I am concerned about a new bacterial infection, his leukocytosis is worsening, he relate he was having night sweats overnight, although he has remained afebrile. We will get a CT of the chest and will start empirically on Rocephin and azithromycin will get blood cultures and sputum cultures.  Anxiety and depression: Continue current home medications.  Chronic pain syndrome: Continue current home medications.  Diabetes mellitus type 2: A1c is 7.4, he continues to have spikes in his blood glucose with  fluids, he is currently on dexamethasone will increase his meal coverage.    Essential hypertension: He is mildly tachycardic and his blood pressure is elevated we will increase his Coreg.  Hypokalemia: Replete orally now resolved.  Rheumatoid arthritis: He is currently on steroids, was given Actemra on 10/22/2018.  DVT prophylaxis: lovenox Family Communication:none Disposition Plan/Barrier to D/C: once back to 4 L of oxygen. Code Status:     Code Status Orders  (From admission, onward)         Start     Ordered   10/21/18 1429  Full code  Continuous     10/21/18 1428        Code Status History    This patient has a current code status but no historical code status.   Advance Care Planning Activity    Advance Directive Documentation     Most Recent Value  Type of Advance Directive  Out of facility DNR (pink MOST or yellow form)  Pre-existing out of facility DNR order (yellow form or pink MOST form)  Pink MOST form placed in chart (order not valid for inpatient use)  "MOST" Form in Place?  -        IV Access:    Peripheral IV   Procedures and diagnostic studies:   No results found.   Medical Consultants:    None.  Anti-Infectives:   None  Subjective:    Luke Phillips he relates his breathing is worse today, he has been having a persistent cough which is productive and dark yellow coloring, he also relates that he was having night sweats.  Objective:  Vitals:   10/27/18 1400 10/27/18 1940 10/27/18 2322 10/28/18 0443  BP: 103/73 114/80    Pulse: (!) 116 (!) 117    Resp: (!) 23 (!) 24    Temp:  97.8 F (36.6 C) 97.9 F (36.6 C) 98.3 F (36.8 C)  TempSrc:  Oral Oral Oral  SpO2: 93% 95%    Weight:      Height:       SpO2: 95 % O2 Flow Rate (L/min): 12 L/min   Intake/Output Summary (Last 24 hours) at 10/28/2018 0759 Last data filed at 10/27/2018 2339 Gross per 24 hour  Intake 1400 ml  Output -  Net 1400 ml   Filed Weights    10/21/18 0657  Weight: 79.4 kg    Exam: General exam: In no acute distress. Respiratory system: Good air movement and diffuse crackles bilaterally Cardiovascular system: S1 & S2 heard, RRR. No JVD. Gastrointestinal system: Abdomen is nondistended, soft and nontender.  Central nervous system: Alert and oriented. No focal neurological deficits. Extremities: No pedal edema. Skin: No rashes, lesions or ulcers Psychiatry: Judgement and insight appear normal. Mood & affect appropriate.    Data Reviewed:    Labs: Basic Metabolic Panel: Recent Labs  Lab 10/23/18 0555 10/24/18 0615 10/25/18 0500 10/26/18 0844 10/27/18 0557  NA 144 142 140 140 142  K 4.4 4.4 4.1 3.8 4.0  CL 105 100 97* 97* 99  CO2 29 32 34* 31 34*  GLUCOSE 157* 103* 89 125* 125*  BUN 15 15 19  21* 20  CREATININE 0.53* 0.58* 0.60* 0.60* 0.60*  CALCIUM 8.4* 8.6* 8.5* 8.6* 8.8*  MG 1.8 1.7 1.8 1.7 2.0  PHOS 2.9 2.4* 2.8 2.4* 2.6   GFR Estimated Creatinine Clearance: 111.7 mL/min (A) (by C-G formula based on SCr of 0.6 mg/dL (L)). Liver Function Tests: Recent Labs  Lab 10/23/18 0555 10/24/18 0615 10/25/18 0500 10/26/18 0844 10/27/18 0557  AST 31 28 23 30 29   ALT 31 32 32 38 45*  ALKPHOS 60 59 62 63 69  BILITOT 0.1* 0.3 0.2* 0.4 0.4  PROT 6.5 6.8 6.9 6.9 7.0  ALBUMIN 2.6* 2.8* 3.0* 3.2* 3.3*   No results for input(s): LIPASE, AMYLASE in the last 168 hours. No results for input(s): AMMONIA in the last 168 hours. Coagulation profile No results for input(s): INR, PROTIME in the last 168 hours. COVID-19 Labs  Recent Labs    10/26/18 0844 10/27/18 0557  DDIMER 1.68* 1.81*  FERRITIN 1,008* 1,069*  CRP 1.1* 0.9    Lab Results  Component Value Date   SARSCOV2NAA POSITIVE (A) 10/21/2018    CBC: Recent Labs  Lab 10/23/18 0555 10/24/18 0615 10/25/18 0500 10/26/18 0844 10/27/18 0557  WBC 5.6 5.3 5.9 7.3 7.4  NEUTROABS 4.3 3.8 4.0 4.3 4.1  HGB 11.8* 12.4* 12.5* 13.2 13.3  HCT 37.7* 40.6 41.3  42.3 43.7  MCV 89.5 89.8 88.8 87.9 88.8  PLT 201 218 226 234 244   Cardiac Enzymes: Recent Labs  Lab 10/23/18 0555 10/24/18 0615 10/25/18 0500 10/26/18 0844 10/27/18 0557  CKTOTAL 64 35* 29* 38* 33*   BNP (last 3 results) No results for input(s): PROBNP in the last 8760 hours. CBG: Recent Labs  Lab 10/26/18 2148 10/27/18 0755 10/27/18 1132 10/27/18 1654 10/27/18 2044  GLUCAP 149* 132* 181* 265* 195*   D-Dimer: Recent Labs    10/26/18 0844 10/27/18 0557  DDIMER 1.68* 1.81*   Hgb A1c: Recent Labs    10/26/18 0844  HGBA1C 7.4*   Lipid  Profile: No results for input(s): CHOL, HDL, LDLCALC, TRIG, CHOLHDL, LDLDIRECT in the last 72 hours. Thyroid function studies: No results for input(s): TSH, T4TOTAL, T3FREE, THYROIDAB in the last 72 hours.  Invalid input(s): FREET3 Anemia work up: Recent Labs    10/26/18 0844 10/27/18 0557  FERRITIN 1,008* 1,069*   Sepsis Labs: Recent Labs  Lab 10/21/18 0856 10/21/18 1000  10/23/18 0555 10/24/18 0615 10/25/18 0500 10/26/18 0844 10/27/18 0557  PROCALCITON  --  0.25  --  0.20  --   --   --   --   WBC 3.4*  --    < > 5.6 5.3 5.9 7.3 7.4  LATICACIDVEN 1.1 0.9  --   --   --   --   --   --    < > = values in this interval not displayed.   Microbiology Recent Results (from the past 240 hour(s))  Culture, blood (routine x 2)     Status: None   Collection Time: 10/21/18  8:55 AM   Specimen: BLOOD  Result Value Ref Range Status   Specimen Description   Final    BLOOD PORTA CATH Performed at [facility], [address] [place], [place] [address]    Special Requests   Final    BOTTLES DRAWN AEROBIC AND ANAEROBIC Blood Culture adequate volume Performed at [facility], [address] [place], [place] [address]    Culture   Final    NO GROWTH 5 DAYS Performed at [facility], [address] [place], [place] [address]    Report Status 10/26/2018 FINAL  Final  Culture,  blood (routine x 2)     Status: None   Collection Time: 10/21/18  8:56 AM   Specimen: BLOOD  Result Value Ref Range Status   Specimen Description   Final    BLOOD PORTA CATH Performed at [facility], [address] [place], [place] [address]    Special Requests   Final    BOTTLES DRAWN AEROBIC AND ANAEROBIC Blood Culture adequate volume Performed at [facility], [address] [place], [place] [address]    Culture   Final    NO GROWTH 5 DAYS Performed at [facility], [address] [place], [place] [address]    Report Status 10/26/2018 FINAL  Final  SARS Coronavirus 2 (CEPHEID - Performed in [facility] lab), Hosp Order     Status: Abnormal   Collection Time: 10/21/18  4:54 PM   Specimen: Nasopharyngeal Swab  Result Value Ref Range Status   SARS Coronavirus 2 POSITIVE (A) NEGATIVE Final    Comment: RESULT CALLED TO, READ BACK BY AND VERIFIED WITH: FRICKEY,J. RN @1920  ON 07.24.2020 BY COHEN,K (NOTE) If result is NEGATIVE SARS-CoV-2 target nucleic acids are NOT DETECTED. The SARS-CoV-2 RNA is generally detectable in upper and lower  respiratory specimens during the acute phase of infection. The lowest  concentration of SARS-CoV-2 viral copies this assay can detect is 250  copies / mL. A negative result does not preclude SARS-CoV-2 infection  and should not be used as the sole basis for treatment or other  patient management decisions.  A negative result may occur with  improper specimen collection / handling, submission of specimen other  than nasopharyngeal swab, presence of viral mutation(s) within the  areas targeted by this assay, and inadequate number of viral copies  (<250 copies / mL). A negative result must be combined with  clinical  observations, patient history, and epidemiological information. If result is POSITIVE SARS-CoV-2 target nucleic acids are DET ECTED. The SARS-CoV-2 RNA is generally detectable in  upper and lower  respiratory specimens during the acute phase of infection.  Positive  results are indicative of active infection with SARS-CoV-2.  Clinical  correlation with patient history and other diagnostic information is  necessary to determine patient infection status.  Positive results do  not rule out bacterial infection or co-infection with other viruses. If result is PRESUMPTIVE POSTIVE SARS-CoV-2 nucleic acids MAY BE PRESENT.   A presumptive positive result was obtained on the submitted specimen  and confirmed on repeat testing.  While 2019 novel coronavirus  (SARS-CoV-2) nucleic acids may be present in the submitted sample  additional confirmatory testing may be necessary for epidemiological  and / or clinical management purposes  to differentiate between  SARS-CoV-2 and other Sarbecovirus currently known to infect humans.  If clinically indicated additional testing with an alternate test  methodology (LA [phone]) is advised. The SARS-CoV-2 RNA is generally  detectable in upper and lower respiratory specimens during the acute  phase of infection. The expected result is Negative. Fact Sheet for Patients:  BoilerBrush.com.cyhttps://www.fda.gov/media/136312/download Fact Sheet for Healthcare Providers: https://pope.com/https://www.fda.gov/media/136313/download This test is not yet approved or cleared by the [place] FDA and has been authorized for detection and/or diagnosis of SARS-CoV-2 by FDA under an Emergency Use Authorization (EUA).  This EUA will remain in effect (meaning this test can be used) for the duration of the COVID-19 declaration under Section 564(b)(1) of the Act, 21 U.S.C. section 360bbb-3(b)(1), unless the authorization is terminated or revoked sooner. Performed at [facility], [address] [place], [place] [address]      Medications:   . alteplase  2 mg Intracatheter Once  . aspirin  325 mg Oral Daily  . atorvastatin  40 mg Oral Daily  . baclofen  5 mg  Oral TID  . carvedilol  6.25 mg Oral BID WC  . dexamethasone  6 mg Oral Q breakfast  . enoxaparin (LOVENOX) injection  40 mg Subcutaneous Q24H  . furosemide  20 mg Oral Daily  . gabapentin  1,600 mg Oral QHS  . gabapentin  800 mg Oral BID  . insulin aspart  0-5 Units Subcutaneous QHS  . insulin aspart  0-9 Units Subcutaneous TID WC  . insulin glargine  10 Units Subcutaneous QHS  . lactulose  20 g Oral BID  . levETIRAcetam  1,000 mg Oral TID  . lidocaine  2 patch Transdermal Q24H  . morphine  60 mg Oral Q8H  . polyethylene glycol  17 g Oral Daily  . potassium chloride  40 mEq Oral BID  . sertraline  100 mg Oral Daily  . sodium chloride flush  3 mL Intravenous Q12H  . venlafaxine  75 mg Oral TID WC   Continuous Infusions: . sodium chloride        LOS: 7 days   Luke Phillips  [facility]  10/28/2018, 7:59 AM

## 2018-10-29 LAB — FERRITIN: Ferritin: 583 ng/mL — ABNORMAL HIGH (ref 24–336)

## 2018-10-29 LAB — CBC WITH DIFFERENTIAL/PLATELET
Abs Immature Granulocytes: 0.1 10*3/uL — ABNORMAL HIGH (ref 0.00–0.07)
Basophils Absolute: 0 10*3/uL (ref 0.0–0.1)
Basophils Relative: 1 %
Eosinophils Absolute: 0.1 10*3/uL (ref 0.0–0.5)
Eosinophils Relative: 1 %
HCT: 43 % (ref 39.0–52.0)
Hemoglobin: 13.2 g/dL (ref 13.0–17.0)
Immature Granulocytes: 1 %
Lymphocytes Relative: 24 %
Lymphs Abs: 2.1 10*3/uL (ref 0.7–4.0)
MCH: 27.7 pg (ref 26.0–34.0)
MCHC: 30.7 g/dL (ref 30.0–36.0)
MCV: 90.3 fL (ref 80.0–100.0)
Monocytes Absolute: 1 10*3/uL (ref 0.1–1.0)
Monocytes Relative: 11 %
Neutro Abs: 5.4 10*3/uL (ref 1.7–7.7)
Neutrophils Relative %: 62 %
Platelets: 267 10*3/uL (ref 150–400)
RBC: 4.76 MIL/uL (ref 4.22–5.81)
RDW: 15.1 % (ref 11.5–15.5)
WBC: 8.7 10*3/uL (ref 4.0–10.5)
nRBC: 0.6 % — ABNORMAL HIGH (ref 0.0–0.2)

## 2018-10-29 LAB — COMPREHENSIVE METABOLIC PANEL
ALT: 41 U/L (ref 0–44)
AST: 23 U/L (ref 15–41)
Albumin: 3.2 g/dL — ABNORMAL LOW (ref 3.5–5.0)
Alkaline Phosphatase: 68 U/L (ref 38–126)
Anion gap: 6 (ref 5–15)
BUN: 20 mg/dL (ref 6–20)
CO2: 34 mmol/L — ABNORMAL HIGH (ref 22–32)
Calcium: 8.5 mg/dL — ABNORMAL LOW (ref 8.9–10.3)
Chloride: 103 mmol/L (ref 98–111)
Creatinine, Ser: 0.59 mg/dL — ABNORMAL LOW (ref 0.61–1.24)
GFR calc Af Amer: >60 mL/min (ref >60)
GFR calc non Af Amer: >60 mL/min (ref >60)
Glucose, Bld: 110 mg/dL — ABNORMAL HIGH (ref 70–99)
Potassium: 4.4 mmol/L (ref 3.5–5.1)
Sodium: 143 mmol/L (ref 135–145)
Total Bilirubin: 0.4 mg/dL (ref 0.3–1.2)
Total Protein: 6.5 g/dL (ref 6.5–8.1)

## 2018-10-29 LAB — MAGNESIUM: Magnesium: 2 mg/dL (ref 1.7–2.4)

## 2018-10-29 LAB — MRSA PCR SCREENING: MRSA by PCR: POSITIVE — AB

## 2018-10-29 LAB — GLUCOSE, CAPILLARY
Glucose-Capillary: 153 mg/dL — ABNORMAL HIGH (ref 70–99)
Glucose-Capillary: 144 mg/dL — ABNORMAL HIGH (ref 70–99)
Glucose-Capillary: 108 mg/dL — ABNORMAL HIGH (ref 70–99)
Glucose-Capillary: 125 mg/dL — ABNORMAL HIGH (ref 70–99)

## 2018-10-29 MED ORDER — CHLORHEXIDINE GLUCONATE CLOTH 2 % EX PADS
6.0000 | MEDICATED_PAD | Freq: Every day | CUTANEOUS | Status: DC
  Administered 2018-10-29 – 2018-11-01 (×4): 6 via TOPICAL

## 2018-10-29 MED ORDER — CARVEDILOL 12.5 MG PO TABS
25.0000 mg | ORAL_TABLET | Freq: Two times a day (BID) | ORAL | Status: DC
  Administered 2018-10-29 – 2018-11-01 (×6): 25 mg via ORAL
  Filled 2018-10-29 (×7): qty 2

## 2018-10-29 MED ORDER — MUPIROCIN 2 % EX OINT
1.0000 "application " | TOPICAL_OINTMENT | Freq: Two times a day (BID) | CUTANEOUS | Status: DC
  Administered 2018-10-29 – 2018-11-01 (×7): 1 via NASAL
  Filled 2018-10-29: qty 22

## 2018-10-29 NOTE — Progress Notes (Signed)
CRITICAL VALUE ALERT  Critical Value: Nasal swab - MRSA positive  Date & Time Notied: 10/29/2018 10:30  Provider Notified: A. Bernita Raisin, MD  Orders Received/Actions taken: Standing order activated for MRSA positive Pt. For MRSA + Pts.

## 2018-10-29 NOTE — Progress Notes (Signed)
Spoke to Pt's spouse today.. Advised her that Pt does have a bacteria PNA and he is on antibx for this.   Also I told her she could bring in a special cup for Pt to use for ADLs and then add in for his water to increase his water intake.   She will bring in around 6pm

## 2018-10-29 NOTE — Plan of Care (Signed)

## 2018-10-29 NOTE — Progress Notes (Signed)
[facility] PROGRESS NOTE    Progress Note  Phill Steck  PYK:[id number] DOB: [birth year] DOA: 10/21/2018 PCP: System, Provider Not In     Brief Narrative:   Luke Phillips is an 58 y.o. male past medical history of severe rheumatoid arthritis, bedbound on long term prednisone, COPD 4 L oxygen dependent, diastolic heart failure, and essential hypertension who was discovered to have SARS-CoV-2 4 days prior and outbreak at a nursing home facility which led everyone being tested and sent to the emergency room on 10/17/2018.  Patient was brought to the ED for fever unable to keep his oxygen saturations above 90% on 10 L high flow.  Started on dexamethasone and Remdesivir.  X-ray done on admission showed left lower lobe infiltrate.  And he was now giving Actemra as he had leukopenia and thrombocytopenia.  Assessment/Plan:   Acute on chronic respiratory failure with hypoxia and hypercapnia due to Pneumonia due to COVID-19 virus and healthcare associated pneumonia: He has multiple risk factors for acute decompensation and multiorgan failure. He completed his course of IV Remdesivir. Single dose of IV Actemra on 10/22/2018 Continue oral dexamethasone 6 mg daily.  He will complete his steroid course tomorrow. His oxygen requirements have improved, today he is on 8 L high flow nasal cannula. Inflammatory markers continue to improve. He continues to deteriorate very slowly his prognosis is guarded.    Healthcare associated pneumonia His leukocytosis was worsening, he relate he was having night sweats overnight, although he has remained afebrile. Personally reviewed CT of the chest done on 10/28/2018 showed no atypical infection it did show chronic lung disease and emphysema with very diffuse central airway thickening and a nonspecific lesion in the right middle bronchus which seems to look like secretions as I scroll through, there also seems to be an infiltrate in the left posterior lower lobe   Continue IV empiric Rocephin and azithromycin he seems to be improving.  Anxiety and depression: Continue current home medications.  Chronic pain syndrome: Continue current home medications.  Diabetes mellitus type 2: A1c is 7.4, blood glucose is well controlled today, will have to decrease sliding scale once he is off steroids tomorrow.    Essential hypertension: His blood pressure is well controlled his today a little bit tachycardic.  We will go ahead and increase his Coreg.  Hypokalemia: Replete orally now resolved.  Rheumatoid arthritis: He is currently on steroids, was given Actemra on 10/22/2018.  DVT prophylaxis: lovenox Family Communication:none Disposition Plan/Barrier to D/C: once back to 4 L of oxygen. Code Status:     Code Status Orders  (From admission, onward)         Start     Ordered   10/21/18 1429  Full code  Continuous     10/21/18 1428        Code Status History    This patient has a current code status but no historical code status.   Advance Care Planning Activity    Advance Directive Documentation     Most Recent Value  Type of Advance Directive  Out of facility DNR (pink MOST or yellow form)  Pre-existing out of facility DNR order (yellow form or pink MOST form)  Pink MOST form placed in chart (order not valid for inpatient use)  "MOST" Form in Place?  -        IV Access:    Peripheral IV   Procedures and diagnostic studies:   Ct Chest Wo Contrast  Result Date: 10/28/2018 CLINICAL DATA:  New onset cough.  COVID-19 infection. EXAM: CT CHEST WITHOUT CONTRAST TECHNIQUE: Multidetector CT imaging of the chest was performed following the standard protocol without IV contrast. COMPARISON:  One view chest 10/21/2018. FINDINGS: Cardiovascular: Atherosclerosis of the aorta, great vessels and coronary arteries. Right subclavian Port-A-Cath extends to the mid SVC. The heart size is normal. There is no pericardial effusion. Mediastinum/Nodes:  There are no enlarged mediastinal, hilar or axillary lymph nodes.1.4 cm left thyroid nodule on image 16/3. The trachea and esophagus appear normal. Lungs/Pleura: No pleural effusion or pneumothorax. There is mild pleural thickening posteriorly in the lower left chest. There is moderate centrilobular and paraseptal emphysema with diffuse central airway thickening. There is an endobronchial lesion within the central aspect of the right middle lobe bronchus, measuring 8 mm on image 38/5. There is mild volume loss in the right middle lobe. There is greater volume loss in the left lower lobe with subpleural opacity favoring rounded atelectasis. Similar subpleural opacity is seen in the left upper lobe along the major fissure. There is a collection of small cavitary lesions at the left lung apex measuring up to 18 mm on coronal image 75/602. Upper abdomen: No acute findings are seen within the visualized upper abdomen. There are several renal calculi extending into the left renal pelvis. There is a 4.1 cm left renal cyst and aortic atherosclerosis. Musculoskeletal/Chest wall: Diffuse muscular atrophy noted. The bones are diffusely demineralized. There are multiple old fractures involving the mid sternal body and multiple ribs. There are multiple thoracic compression deformities, most severe at T6 and T8. There are lesser fractures involving the T7, T9, T11, T12 and L1 vertebral bodies. No definite acute osseous findings. IMPRESSION: 1. No typical findings of acute viral pneumonia. 2. Chronic lung disease with emphysema and diffuse central airway thickening. Small nonspecific endobronchial lesion in the right middle lobe bronchus with minimal volume loss. 3. More extensive volume loss and subpleural opacity in the left upper and lower lobes with adjacent pleural thickening. Given evidence of significant previous chest trauma, these findings may reflect rounded atelectasis or posttraumatic scarring. Radiographic follow up  recommended. 4. Multiple fractures of the ribs, sternum and thoracolumbar spine. 5. 14 mm left thyroid nodule. Consider further evaluation with elective thyroid ultrasound after resolution of the patient's acute illness. Electronically Signed   By: Carey Bullocks M.D.   On: 10/28/2018 13:43     Medical Consultants:    None.  Anti-Infectives:   None  Subjective:    Jill Alexanders relates his sweating has resolved, he continues to have a productive cough.  Objective:    Vitals:   10/28/18 1717 10/28/18 1935 10/28/18 1950 10/29/18 0447  BP: 125/80  114/77 (!) 150/95  Pulse:   (!) 110 95  Resp:      Temp: 97.7 F (36.5 C) 98 F (36.7 C) 98 F (36.7 C) 99 F (37.2 C)  TempSrc: Oral Oral Oral Oral  SpO2:   95% 98%  Weight:      Height:       SpO2: 98 % O2 Flow Rate (L/min): 8 L/min   Intake/Output Summary (Last 24 hours) at 10/29/2018 0826 Last data filed at 10/29/2018 0600 Gross per 24 hour  Intake 350 ml  Output 600 ml  Net -250 ml   Filed Weights   10/21/18 0657  Weight: 79.4 kg    Exam: General exam: In no acute distress. Respiratory system: Good air movement and diffuse crackles bilaterally Cardiovascular system: S1 & S2 heard, RRR. No  JVD. Gastrointestinal system: Abdomen is nondistended, soft and nontender.  Central nervous system: Alert and oriented. No focal neurological deficits. Extremities: No pedal edema. Skin: No rashes, lesions or ulcers Psychiatry: Judgement and insight appear normal. Mood & affect appropriate.    Data Reviewed:    Labs: Basic Metabolic Panel: Recent Labs  Lab 10/24/18 0615 10/25/18 0500 10/26/18 0844 10/27/18 0557 10/28/18 0714 10/29/18 0510  NA 142 140 140 142 141 143  K 4.4 4.1 3.8 4.0 4.3 4.4  CL 100 97* 97* 99 99 103  CO2 32 34* 31 34* 33* 34*  GLUCOSE 103* 89 125* 125* 168* 110*  BUN 15 19 21* 20 21* 20  CREATININE 0.58* 0.60* 0.60* 0.60* 0.67 0.59*  CALCIUM 8.6* 8.5* 8.6* 8.8* 8.9 8.5*  MG 1.7 1.8 1.7  2.0 2.1 2.0  PHOS 2.4* 2.8 2.4* 2.6 3.1  --    GFR Estimated Creatinine Clearance: 111.7 mL/min (A) (by C-G formula based on SCr of 0.59 mg/dL (L)). Liver Function Tests: Recent Labs  Lab 10/25/18 0500 10/26/18 0844 10/27/18 0557 10/28/18 0714 10/29/18 0510  AST 23 30 29 25 23   ALT 32 38 45* 47* 41  ALKPHOS 62 63 69 77 68  BILITOT 0.2* 0.4 0.4 0.4 0.4  PROT 6.9 6.9 7.0 7.3 6.5  ALBUMIN 3.0* 3.2* 3.3* 3.6 3.2*   No results for input(s): LIPASE, AMYLASE in the last 168 hours. No results for input(s): AMMONIA in the last 168 hours. Coagulation profile No results for input(s): INR, PROTIME in the last 168 hours. COVID-19 Labs  Recent Labs    10/26/18 0844 10/27/18 0557 10/28/18 0714 10/29/18 0510  DDIMER 1.68* 1.81* 2.51*  --   FERRITIN 1,008* 1,069* 1,081* 583*  CRP 1.1* 0.9 1.0*  --     Lab Results  Component Value Date   SARSCOV2NAA POSITIVE (A) 10/21/2018    CBC: Recent Labs  Lab 10/25/18 0500 10/26/18 0844 10/27/18 0557 10/28/18 0714 10/29/18 0510  WBC 5.9 7.3 7.4 9.7 8.7  NEUTROABS 4.0 4.3 4.1 5.4 5.4  HGB 12.5* 13.2 13.3 14.5 13.2  HCT 41.3 42.3 43.7 47.0 43.0  MCV 88.8 87.9 88.8 89.7 90.3  PLT 226 234 244 279 267   Cardiac Enzymes: Recent Labs  Lab 10/24/18 0615 10/25/18 0500 10/26/18 0844 10/27/18 0557 10/28/18 0714  CKTOTAL 35* 29* 38* 33* 43*   BNP (last 3 results) No results for input(s): PROBNP in the last 8760 hours. CBG: Recent Labs  Lab 10/28/18 0752 10/28/18 1207 10/28/18 1704 10/28/18 2135 10/29/18 0739  GLUCAP 146* 214* 186* 133* 153*   D-Dimer: Recent Labs    10/27/18 0557 10/28/18 0714  DDIMER 1.81* 2.51*   Hgb A1c: Recent Labs    10/26/18 0844  HGBA1C 7.4*   Lipid Profile: No results for input(s): CHOL, HDL, LDLCALC, TRIG, CHOLHDL, LDLDIRECT in the last 72 hours. Thyroid function studies: No results for input(s): TSH, T4TOTAL, T3FREE, THYROIDAB in the last 72 hours.  Invalid input(s): FREET3 Anemia  work up: Recent Labs    10/28/18 0714 10/29/18 0510  FERRITIN 1,081* 583*   Sepsis Labs: Recent Labs  Lab 10/23/18 0555  10/26/18 0844 10/27/18 0557 10/28/18 0714 10/29/18 0510  PROCALCITON 0.20  --   --   --   --   --   WBC 5.6   < > 7.3 7.4 9.7 8.7   < > = values in this interval not displayed.   Microbiology Recent Results (from the past 240 hour(s))  Culture, blood (routine  x 2)     Status: None   Collection Time: 10/21/18  8:55 AM   Specimen: BLOOD  Result Value Ref Range Status   Specimen Description   Final    BLOOD PORTA CATH Performed at [facility], [address], [place], [place] [address]    Special Requests   Final    BOTTLES DRAWN AEROBIC AND ANAEROBIC Blood Culture adequate volume Performed at [facility], [address] [place], [place] [address]    Culture   Final    NO GROWTH 5 DAYS Performed at [facility], [address] [place], [place] [address]    Report Status 10/26/2018 FINAL  Final  Culture, blood (routine x 2)     Status: None   Collection Time: 10/21/18  8:56 AM   Specimen: BLOOD  Result Value Ref Range Status   Specimen Description   Final    BLOOD PORTA CATH Performed at [facility], [address] [place], [place] [address]    Special Requests   Final    BOTTLES DRAWN AEROBIC AND ANAEROBIC Blood Culture adequate volume Performed at [facility], [address], [place], [place] [address]    Culture   Final    NO GROWTH 5 DAYS Performed at [facility], [address], [place], [place] [address]    Report Status 10/26/2018 FINAL  Final  SARS Coronavirus 2 (CEPHEID - Performed in [facility] hospital lab), Hosp Order     Status: Abnormal   Collection Time: 10/21/18  4:54 PM   Specimen: Nasopharyngeal Swab  Result Value Ref Range Status   SARS Coronavirus 2 POSITIVE (A) NEGATIVE Final    Comment: RESULT CALLED TO, READ BACK BY AND  VERIFIED WITH: FRICKEY,J. RN  ON 07.24.2020 BY COHEN,K (NOTE) If result is NEGATIVE SARS-CoV-2 target nucleic acids are NOT DETECTED. The SARS-CoV-2 RNA is generally detectable in upper and lower  respiratory specimens during the acute phase of infection. The lowest  concentration of SARS-CoV-2 viral copies this assay can detect is 250  copies / mL. A negative result does not preclude SARS-CoV-2 infection  and should not be used as the sole basis for treatment or other  patient management decisions.  A negative result may occur with  improper specimen collection / handling, submission of specimen other  than nasopharyngeal swab, presence of viral mutation(s) within the  areas targeted by this assay, and inadequate number of viral copies  (<250 copies / mL). A negative result must be combined with clinical  observations, patient history, and epidemiological information. If result is POSITIVE SARS-CoV-2 target nucleic acids are DET ECTED. The SARS-CoV-2 RNA is generally detectable in upper and lower  respiratory specimens during the acute phase of infection.  Positive  results are indicative of active infection with SARS-CoV-2.  Clinical  correlation with patient history and other diagnostic information is  necessary to determine patient infection status.  Positive results do  not rule out bacterial infection or co-infection with other viruses. If result is PRESUMPTIVE POSTIVE SARS-CoV-2 nucleic acids MAY BE PRESENT.   A presumptive positive result was obtained on the submitted specimen  and confirmed on repeat testing.  While 2019 novel coronavirus  (SARS-CoV-2) nucleic acids may be present in the submitted sample  additional confirmatory testing may be necessary for epidemiological  and / or clinical management purposes  to differentiate between  SARS-CoV-2 and other Sarbecovirus currently known to infect humans.  If clinically indicated additional testing with an alternate test   methodology ([phone]) is advised. The SARS-CoV-2 RNA is generally  detectable in upper and lower respiratory specimens during the acute  phase of infection. The expected result is Negative. Fact Sheet for Patients:  BoilerBrush.com.cyhttps://www.fda.gov/media/136312/download Fact Sheet for Healthcare Providers: https://pope.com/https://www.fda.gov/media/136313/download This test is not yet approved or cleared by the [place] FDA and has been authorized for detection and/or diagnosis of SARS-CoV-2 by FDA under an Emergency Use Authorization (EUA).  This EUA will remain in effect (meaning this test can be used) for the duration of the COVID-19 declaration under Section 564(b)(1) of the Act, 21 U.S.C. section 360bbb-3(b)(1), unless the authorization is terminated or revoked sooner. Performed at [facility], [address] [place], [place] [address]   Culture, sputum-assessment     Status: None   Collection Time: 10/28/18  8:40 AM   Specimen: Sputum  Result Value Ref Range Status   Specimen Description SPU EXPECTORATED  Final   Special Requests NONE  Final   Sputum evaluation   Final    THIS SPECIMEN IS ACCEPTABLE FOR SPUTUM CULTURE Performed at [facility], [address] [place], [place] [address]    Report Status 10/28/2018 FINAL  Final  Culture, respiratory     Status: None (Preliminary result)   Collection Time: 10/28/18  8:40 AM   Specimen: Sputum  Result Value Ref Range Status   Specimen Description   Final    SPU EXPECTORATED Performed at [facility], [address] [place], [place] [address]    Special Requests   Final    NONE Reflexed from [id number] Performed at [facility], [address] [place], [place] [address]    Gram Stain   Final    RARE WBC PRESENT,BOTH PMN AND MONONUCLEAR MODERATE GRAM POSITIVE COCCI IN PAIRS IN CLUSTERS Performed at [facility], [address] [place], [place] [address]     Culture PENDING  Incomplete   Report Status PENDING  Incomplete  Culture, blood (routine x 2) Call MD if unable to obtain prior to antibiotics being given     Status: None (Preliminary result)   Collection Time: 10/28/18  9:05 AM   Specimen: BLOOD  Result Value Ref Range Status   Specimen Description   Final    BLOOD RIGHT HAND Performed at [facility], [address] [place], [place] [address]    Special Requests   Final    BOTTLES DRAWN AEROBIC ONLY Blood Culture adequate volume Performed at [facility], [address] [place], [place] [address]    Culture   Final    NO GROWTH < 24 HOURS Performed at [facility], [address] [place], [place] [address]    Report Status PENDING  Incomplete  Culture, blood (Routine X 2) w Reflex to ID Panel     Status: None (Preliminary result)   Collection Time: 10/28/18  9:10 AM   Specimen: BLOOD  Result Value Ref Range Status   Specimen Description   Final    BLOOD LEFT ARM Performed at [facility], [address] [place], [place] [address]    Special Requests   Final    BOTTLES DRAWN AEROBIC ONLY Blood Culture adequate volume Performed at [facility], [address] [place], [place] [address]    Culture   Final    NO GROWTH < 24 HOURS Performed  at [facility], [address] [place], [place] [address]    Report Status PENDING  Incomplete     Medications:   . aspirin  325 mg Oral Daily  . atorvastatin  40 mg Oral Daily  . baclofen  5 mg Oral TID  . carvedilol  12.5 mg Oral BID WC  . dexamethasone  6 mg Oral Q breakfast  . enoxaparin (LOVENOX) injection  40 mg Subcutaneous Q24H  . furosemide  20 mg Oral Daily  . gabapentin  1,600 mg Oral QHS  . gabapentin  800 mg Oral BID  . insulin aspart  0-20 Units Subcutaneous TID WC  . insulin aspart  0-5 Units Subcutaneous QHS  . insulin aspart  10 Units Subcutaneous TID WC  . insulin  glargine  10 Units Subcutaneous QHS  . lactulose  20 g Oral BID  . levETIRAcetam  1,000 mg Oral TID  . lidocaine  2 patch Transdermal Q24H  . morphine  60 mg Oral Q8H  . polyethylene glycol  17 g Oral Daily  . potassium chloride  40 mEq Oral BID  . sertraline  100 mg Oral Daily  . sodium chloride flush  10-40 mL Intracatheter Q12H  . sodium chloride flush  3 mL Intravenous Q12H  . venlafaxine  75 mg Oral TID WC   Continuous Infusions: . sodium chloride    . azithromycin 500 mg (10/28/18 1337)  . cefTRIAXone (ROCEPHIN)  IV 2 g (10/28/18 1449)      LOS: 8 days   Marinda ElkAbraham Feliz Ortiz  [facility]  10/29/2018, 8:26 AM

## 2018-10-30 LAB — CBC WITH DIFFERENTIAL/PLATELET
Abs Immature Granulocytes: 0.06 10*3/uL (ref 0.00–0.07)
Basophils Absolute: 0 10*3/uL (ref 0.0–0.1)
Basophils Relative: 1 %
Eosinophils Absolute: 0.1 10*3/uL (ref 0.0–0.5)
Eosinophils Relative: 1 %
HCT: 41.1 % (ref 39.0–52.0)
Hemoglobin: 12.5 g/dL — ABNORMAL LOW (ref 13.0–17.0)
Immature Granulocytes: 1 %
Lymphocytes Relative: 32 %
Lymphs Abs: 2.1 10*3/uL (ref 0.7–4.0)
MCH: 27.8 pg (ref 26.0–34.0)
MCHC: 30.4 g/dL (ref 30.0–36.0)
MCV: 91.3 fL (ref 80.0–100.0)
Monocytes Absolute: 0.7 10*3/uL (ref 0.1–1.0)
Monocytes Relative: 10 %
Neutro Abs: 3.7 10*3/uL (ref 1.7–7.7)
Neutrophils Relative %: 55 %
Platelets: 244 10*3/uL (ref 150–400)
RBC: 4.5 MIL/uL (ref 4.22–5.81)
RDW: 15.2 % (ref 11.5–15.5)
WBC: 6.6 10*3/uL (ref 4.0–10.5)
nRBC: 0.5 % — ABNORMAL HIGH (ref 0.0–0.2)

## 2018-10-30 LAB — COMPREHENSIVE METABOLIC PANEL
ALT: 35 U/L (ref 0–44)
AST: 22 U/L (ref 15–41)
Albumin: 3.3 g/dL — ABNORMAL LOW (ref 3.5–5.0)
Alkaline Phosphatase: 69 U/L (ref 38–126)
Anion gap: 11 (ref 5–15)
BUN: 20 mg/dL (ref 6–20)
CO2: 31 mmol/L (ref 22–32)
Calcium: 8.7 mg/dL — ABNORMAL LOW (ref 8.9–10.3)
Chloride: 98 mmol/L (ref 98–111)
Creatinine, Ser: 0.67 mg/dL (ref 0.61–1.24)
GFR calc Af Amer: >60 mL/min (ref >60)
GFR calc non Af Amer: >60 mL/min (ref >60)
Glucose, Bld: 120 mg/dL — ABNORMAL HIGH (ref 70–99)
Potassium: 4.1 mmol/L (ref 3.5–5.1)
Sodium: 140 mmol/L (ref 135–145)
Total Bilirubin: 0.6 mg/dL (ref 0.3–1.2)
Total Protein: 6.6 g/dL (ref 6.5–8.1)

## 2018-10-30 LAB — GLUCOSE, CAPILLARY
Glucose-Capillary: 158 mg/dL — ABNORMAL HIGH (ref 70–99)
Glucose-Capillary: 221 mg/dL — ABNORMAL HIGH (ref 70–99)
Glucose-Capillary: 189 mg/dL — ABNORMAL HIGH (ref 70–99)

## 2018-10-30 LAB — FERRITIN: Ferritin: 853 ng/mL — ABNORMAL HIGH (ref 24–336)

## 2018-10-30 LAB — LEGIONELLA PNEUMOPHILA SEROGP 1 UR AG: L. pneumophila Serogp 1 Ur Ag: NEGATIVE

## 2018-10-30 LAB — LACTATE DEHYDROGENASE: LDH: 161 U/L (ref 98–192)

## 2018-10-30 LAB — MAGNESIUM: Magnesium: 2.1 mg/dL (ref 1.7–2.4)

## 2018-10-30 MED ORDER — ACETAMINOPHEN 325 MG PO TABS
650.0000 mg | ORAL_TABLET | ORAL | Status: DC | PRN
  Administered 2018-10-30: 650 mg via ORAL
  Filled 2018-10-30: qty 2

## 2018-10-30 NOTE — Progress Notes (Signed)
Spoke to Pt's wife, updated her on his status, no real changes at this time.Ludwig Lean her for bringing in his drinking cups so the patient can increase his water intake, along with the flavor packet he likes to add to the water.

## 2018-10-30 NOTE — Progress Notes (Signed)
[facility] PROGRESS NOTE    Progress Note  Luke Phillips  RSW:[id number] DOB: 25-Aug-1960 DOA: 10/21/2018 PCP: System, Provider Not In     Brief Narrative:   Luke Phillips is an 58 y.o. male past medical history of severe rheumatoid arthritis, bedbound on long term prednisone, COPD 4 L oxygen dependent, diastolic heart failure, and essential hypertension who was discovered to have SARS-CoV-2 4 days prior and outbreak at a nursing home facility which led everyone being tested and sent to the emergency room on 10/17/2018.  Patient was brought to the ED for fever unable to keep his oxygen saturations above 90% on 10 L high flow.  Started on dexamethasone and Remdesivir.  X-ray done on admission showed left lower lobe infiltrate.  And he was now giving Actemra as he had leukopenia and thrombocytopenia.  Significant events: He completed his course of IV Remdesivir. Single dose of IV Actemra on 10/22/2018 Continue oral dexamethasone for a total of 10 days. Assessment/Plan:   Acute on chronic respiratory failure with hypoxia and hypercapnia due to Pneumonia due to COVID-19 virus and healthcare associated pneumonia: He has multiple risk factors for acute decompensation and multiorgan failure. His oxygen saturation continues to improve he is currently on his baseline needs a 4 L of oxygen. Continue dexamethasone for total of 10 days his last day is today.  Healthcare associated pneumonia His leukocytosis is improved he denies any night sweats. Continue IV Rocephin and azithromycin, his oxygen demand is at baseline. Continue Robitussin incentive spirometry and flutter valve. Relates he feels lousy and tired.  He has remained afebrile his leukocytosis improved. Check LDH and ferritin.  Anxiety and depression: Continue current regimen.  Chronic pain syndrome: Is controlled continue current regimen.  Diabetes mellitus type 2: A1c is 7.4, blood glucose is fairly controlled, he is to  complete his dexamethasone today, after this will decrease his long-acting insulin.    Essential hypertension: Blood pressure and heart rate are improved today. Continue current regimen.  Hypokalemia: Replete orally now resolved.  Rheumatoid arthritis: He is currently on steroids, was given Actemra on 10/22/2018.  DVT prophylaxis: lovenox Family Communication:none Disposition Plan/Barrier to D/C: Probably skilled nursing facility at 10/31/2018 Code Status:     Code Status Orders  (From admission, onward)         Start     Ordered   10/21/18 1429  Full code  Continuous     10/21/18 1428        Code Status History    This patient has a current code status but no historical code status.   Advance Care Planning Activity    Advance Directive Documentation     Most Recent Value  Type of Advance Directive  Out of facility DNR (pink MOST or yellow form)  Pre-existing out of facility DNR order (yellow form or pink MOST form)  Pink MOST form placed in chart (order not valid for inpatient use)  "MOST" Form in Place?  -        IV Access:    Peripheral IV   Procedures and diagnostic studies:   Ct Chest Wo Contrast  Result Date: 10/28/2018 CLINICAL DATA:  New onset cough.  COVID-19 infection. EXAM: CT CHEST WITHOUT CONTRAST TECHNIQUE: Multidetector CT imaging of the chest was performed following the standard protocol without IV contrast. COMPARISON:  One view chest 10/21/2018. FINDINGS: Cardiovascular: Atherosclerosis of the aorta, great vessels and coronary arteries. Right subclavian Port-A-Cath extends to the mid SVC. The heart size is normal. There  is no pericardial effusion. Mediastinum/Nodes: There are no enlarged mediastinal, hilar or axillary lymph nodes.1.4 cm left thyroid nodule on image 16/3. The trachea and esophagus appear normal. Lungs/Pleura: No pleural effusion or pneumothorax. There is mild pleural thickening posteriorly in the lower left chest. There is moderate  centrilobular and paraseptal emphysema with diffuse central airway thickening. There is an endobronchial lesion within the central aspect of the right middle lobe bronchus, measuring 8 mm on image 38/5. There is mild volume loss in the right middle lobe. There is greater volume loss in the left lower lobe with subpleural opacity favoring rounded atelectasis. Similar subpleural opacity is seen in the left upper lobe along the major fissure. There is a collection of small cavitary lesions at the left lung apex measuring up to 18 mm on coronal image 75/602. Upper abdomen: No acute findings are seen within the visualized upper abdomen. There are several renal calculi extending into the left renal pelvis. There is a 4.1 cm left renal cyst and aortic atherosclerosis. Musculoskeletal/Chest wall: Diffuse muscular atrophy noted. The bones are diffusely demineralized. There are multiple old fractures involving the mid sternal body and multiple ribs. There are multiple thoracic compression deformities, most severe at T6 and T8. There are lesser fractures involving the T7, T9, T11, T12 and L1 vertebral bodies. No definite acute osseous findings. IMPRESSION: 1. No typical findings of acute viral pneumonia. 2. Chronic lung disease with emphysema and diffuse central airway thickening. Small nonspecific endobronchial lesion in the right middle lobe bronchus with minimal volume loss. 3. More extensive volume loss and subpleural opacity in the left upper and lower lobes with adjacent pleural thickening. Given evidence of significant previous chest trauma, these findings may reflect rounded atelectasis or posttraumatic scarring. Radiographic follow up recommended. 4. Multiple fractures of the ribs, sternum and thoracolumbar spine. 5. 14 mm left thyroid nodule. Consider further evaluation with elective thyroid ultrasound after resolution of the patient's acute illness. Electronically Signed   By: Carey BullocksWilliam  Veazey M.D.   On: 10/28/2018  13:43     Medical Consultants:    None.  Anti-Infectives:   None  Subjective:    Luke Phillips relates he feels tired and worn out, his breathing is unchanged.  But he relates he feels like he has low energy.  Objective:    Vitals:   10/29/18 0827 10/29/18 1657 10/29/18 1800 10/29/18 1945  BP: 135/82 122/82  101/76  Pulse: (!) 114 99 (!) 101 97  Resp: (!) 21 (!) 21 14 17   Temp: 98 F (36.7 C)   97.9 F (36.6 C)  TempSrc:    Oral  SpO2: 93% 97% 96% 99%  Weight:      Height:       SpO2: 99 % O2 Flow Rate (L/min): 4 L/min   Intake/Output Summary (Last 24 hours) at 10/30/2018 0814 Last data filed at 10/30/2018 0656 Gross per 24 hour  Intake 360 ml  Output 1375 ml  Net -1015 ml   Filed Weights   10/21/18 0657  Weight: 79.4 kg    Exam: General exam: In no acute distress. Respiratory system: Good air movement and diffuse crackles bilaterally. Cardiovascular system: S1 & S2 heard, RRR. No JVD. Gastrointestinal system: Abdomen is nondistended, soft and nontender.  Central nervous system: Alert and oriented. No focal neurological deficits. Extremities: No pedal edema. Skin: No rashes, lesions or ulcers Psychiatry: Judgement and insight appear normal. Mood & affect appropriate.    Data Reviewed:    Labs: Basic Metabolic Panel:  Recent Labs  Lab 10/24/18 0615 10/25/18 0500 10/26/18 0844 10/27/18 0557 10/28/18 0714 10/29/18 0510  NA 142 140 140 142 141 143  K 4.4 4.1 3.8 4.0 4.3 4.4  CL 100 97* 97* 99 99 103  CO2 32 34* 31 34* 33* 34*  GLUCOSE 103* 89 125* 125* 168* 110*  BUN 15 19 21* 20 21* 20  CREATININE 0.58* 0.60* 0.60* 0.60* 0.67 0.59*  CALCIUM 8.6* 8.5* 8.6* 8.8* 8.9 8.5*  MG 1.7 1.8 1.7 2.0 2.1 2.0  PHOS 2.4* 2.8 2.4* 2.6 3.1  --    GFR Estimated Creatinine Clearance: 111.7 mL/min (A) (by C-G formula based on SCr of 0.59 mg/dL (L)). Liver Function Tests: Recent Labs  Lab 10/25/18 0500 10/26/18 0844 10/27/18 0557 10/28/18 0714  10/29/18 0510  AST 23 30 29 25 23   ALT 32 38 45* 47* 41  ALKPHOS 62 63 69 77 68  BILITOT 0.2* 0.4 0.4 0.4 0.4  PROT 6.9 6.9 7.0 7.3 6.5  ALBUMIN 3.0* 3.2* 3.3* 3.6 3.2*   No results for input(s): LIPASE, AMYLASE in the last 168 hours. No results for input(s): AMMONIA in the last 168 hours. Coagulation profile No results for input(s): INR, PROTIME in the last 168 hours. COVID-19 Labs  Recent Labs    10/28/18 0714 10/29/18 0510  DDIMER 2.51*  --   FERRITIN 1,081* 583*  CRP 1.0*  --     Lab Results  Component Value Date   SARSCOV2NAA POSITIVE (A) 10/21/2018    CBC: Recent Labs  Lab 10/26/18 0844 10/27/18 0557 10/28/18 0714 10/29/18 0510 10/30/18 0645  WBC 7.3 7.4 9.7 8.7 6.6  NEUTROABS 4.3 4.1 5.4 5.4 3.7  HGB 13.2 13.3 14.5 13.2 12.5*  HCT 42.3 43.7 47.0 43.0 41.1  MCV 87.9 88.8 89.7 90.3 91.3  PLT 234 244 279 267 244   Cardiac Enzymes: Recent Labs  Lab 10/24/18 0615 10/25/18 0500 10/26/18 0844 10/27/18 0557 10/28/18 0714  CKTOTAL 35* 29* 38* 33* 43*   BNP (last 3 results) No results for input(s): PROBNP in the last 8760 hours. CBG: Recent Labs  Lab 10/28/18 2135 10/29/18 0739 10/29/18 1201 10/29/18 1653 10/29/18 2112  GLUCAP 133* 153* 144* 108* 125*   D-Dimer: Recent Labs    10/28/18 0714  DDIMER 2.51*   Hgb A1c: No results for input(s): HGBA1C in the last 72 hours. Lipid Profile: No results for input(s): CHOL, HDL, LDLCALC, TRIG, CHOLHDL, LDLDIRECT in the last 72 hours. Thyroid function studies: No results for input(s): TSH, T4TOTAL, T3FREE, THYROIDAB in the last 72 hours.  Invalid input(s): FREET3 Anemia work up: Recent Labs    10/28/18 0714 10/29/18 0510  FERRITIN 1,081* 583*   Sepsis Labs: Recent Labs  Lab 10/27/18 0557 10/28/18 0714 10/29/18 0510 10/30/18 0645  WBC 7.4 9.7 8.7 6.6   Microbiology Recent Results (from the past 240 hour(s))  Culture, blood (routine x 2)     Status: None   Collection Time: 10/21/18   8:55 AM   Specimen: BLOOD  Result Value Ref Range Status   Specimen Description   Final    BLOOD PORTA CATH Performed at [facility], [address], [place], [place] [address]    Special Requests   Final    BOTTLES DRAWN AEROBIC AND ANAEROBIC Blood Culture adequate volume Performed at [facility], [address], [place], [place] [address]    Culture   Final    NO GROWTH 5 DAYS Performed at [facility], [address], [place], [place] [address]    Report Status 10/26/2018 FINAL  Final  Culture, blood (routine x 2)     Status: None   Collection Time: 10/21/18  8:56 AM   Specimen: BLOOD  Result Value Ref Range Status   Specimen Description   Final    BLOOD PORTA CATH Performed at [facility], [address] [place], [place] [address]    Special Requests   Final    BOTTLES DRAWN AEROBIC AND ANAEROBIC Blood Culture adequate volume Performed at [facility], [address] [place], [place] [address]    Culture   Final    NO GROWTH 5 DAYS Performed at [facility], [address], [place], [place] [address]    Report Status 10/26/2018 FINAL  Final  SARS Coronavirus 2 (CEPHEID - Performed in [facility] hospital lab), Hosp Order     Status: Abnormal   Collection Time: 10/21/18  4:54 PM   Specimen: Nasopharyngeal Swab  Result Value Ref Range Status   SARS Coronavirus 2 POSITIVE (A) NEGATIVE Final    Comment: RESULT CALLED TO, READ BACK BY AND VERIFIED WITH: FRICKEY,J. RN @1920  ON 07.24.2020 BY COHEN,K (NOTE) If result is NEGATIVE SARS-CoV-2 target nucleic acids are NOT DETECTED. The SARS-CoV-2 RNA is generally detectable in upper and lower  respiratory specimens during the acute phase of infection. The lowest  concentration of SARS-CoV-2 viral copies this assay can detect is 250  copies / mL. A negative result does not preclude SARS-CoV-2 infection  and should not be used as the sole  basis for treatment or other  patient management decisions.  A negative result may occur with  improper specimen collection / handling, submission of specimen other  than nasopharyngeal swab, presence of viral mutation(s) within the  areas targeted by this assay, and inadequate number of viral copies  (<250 copies / mL). A negative result must be combined with clinical  observations, patient history, and epidemiological information. If result is POSITIVE SARS-CoV-2 target nucleic acids are DET ECTED. The SARS-CoV-2 RNA is generally detectable in upper and lower  respiratory specimens during the acute phase of infection.  Positive  results are indicative of active infection with SARS-CoV-2.  Clinical  correlation with patient history and other diagnostic information is  necessary to determine patient infection status.  Positive results do  not rule out bacterial infection or co-infection with other viruses. If result is PRESUMPTIVE POSTIVE SARS-CoV-2 nucleic acids MAY BE PRESENT.   A presumptive positive result was obtained on the submitted specimen  and confirmed on repeat testing.  While 2019 novel coronavirus  (SARS-CoV-2) nucleic acids may be present in the submitted sample  additional confirmatory testing may be necessary for epidemiological  and / or clinical management purposes  to differentiate between  SARS-CoV-2 and other Sarbecovirus currently known to infect humans.  If clinically indicated additional testing with an alternate test  methodology (LA [id number]) is advised. The SARS-CoV-2 RNA is generally  detectable in upper and lower respiratory specimens during the acute  phase of infection. The expected result is Negative. Fact Sheet for Patients:  C5885 Fact Sheet for Healthcare Providers: BoilerBrush.com.cy This test is not yet approved or cleared by the https://pope.com/ FDA and has been authorized for detection  and/or diagnosis of SARS-CoV-2 by FDA under an Emergency Use Authorization (EUA).  This EUA will remain in effect (meaning this test can be used) for the duration of the COVID-19 declaration  under Section 564(b)(1) of the Act, 21 U.S.C. section 360bbb-3(b)(1), unless the authorization is terminated or revoked sooner. Performed at [facility], [address], [place], [place] [address]   Culture, sputum-assessment     Status: None   Collection Time: 10/28/18  8:40 AM   Specimen: Sputum  Result Value Ref Range Status   Specimen Description SPU EXPECTORATED  Final   Special Requests NONE  Final   Sputum evaluation   Final    THIS SPECIMEN IS ACCEPTABLE FOR SPUTUM CULTURE Performed at [facility], [address] [place], [place] [address]    Report Status 10/28/2018 FINAL  Final  Culture, respiratory     Status: None (Preliminary result)   Collection Time: 10/28/18  8:40 AM   Specimen: Sputum  Result Value Ref Range Status   Specimen Description   Final    SPU EXPECTORATED Performed at [facility], [address] [place], [place] [address]    Special Requests   Final    NONE Reflexed from [phone] Performed at [facility], [address], [place], [place] [address]    Gram Stain   Final    RARE WBC PRESENT,BOTH PMN AND MONONUCLEAR MODERATE GRAM POSITIVE COCCI IN PAIRS IN CLUSTERS Performed at [facility], [address] [place], [place] [address]    Culture PENDING  Incomplete   Report Status PENDING  Incomplete  Culture, blood (routine x 2) Call MD if unable to obtain prior to antibiotics being given     Status: None (Preliminary result)   Collection Time: 10/28/18  9:05 AM   Specimen: BLOOD  Result Value Ref Range Status   Specimen Description   Final    BLOOD RIGHT HAND Performed at [facility], [address] [place], [place] [address]    Special Requests   Final     BOTTLES DRAWN AEROBIC ONLY Blood Culture adequate volume Performed at [facility], [address] [place], [place] [address]    Culture   Final    NO GROWTH 2 DAYS Performed at [facility], [address] [place], [place] [address]    Report Status PENDING  Incomplete  Culture, blood (Routine X 2) w Reflex to ID Panel     Status: None (Preliminary result)   Collection Time: 10/28/18  9:10 AM   Specimen: BLOOD  Result Value Ref Range Status   Specimen Description   Final    BLOOD LEFT ARM Performed at [facility], [address], [place], [place] [address]    Special Requests   Final    BOTTLES DRAWN AEROBIC ONLY Blood Culture adequate volume Performed at [facility], [address], [place], [place] [address]    Culture   Final    NO GROWTH 2 DAYS Performed at [facility], [address], [place], [place] [address]    Report Status PENDING  Incomplete  MRSA PCR Screening     Status: Abnormal   Collection Time: 10/29/18  2:01 AM   Specimen: Nasopharyngeal  Result Value Ref Range Status   MRSA by PCR POSITIVE (A) NEGATIVE Final    Comment:        The GeneXpert MRSA Assay (FDA approved for NASAL specimens only), is one component of a comprehensive MRSA colonization surveillance program. It is not intended to diagnose MRSA infection nor to guide or monitor treatment for MRSA infections. RESULT  CALLED TO, READ BACK BY AND VERIFIED WITH: S.SHEATH,RN [id number] @0922  BY V.WILKINS Performed at [facility], [address] [place], [place] [id number]      Medications:   . aspirin  325 mg Oral Daily  . atorvastatin  40 mg Oral Daily  . baclofen  5 mg Oral TID  . carvedilol  25 mg Oral BID WC  . Chlorhexidine Gluconate Cloth  6 each Topical Q0600  . dexamethasone  6 mg Oral Q breakfast  . enoxaparin (LOVENOX) injection  40 mg Subcutaneous Q24H  . furosemide  20 mg Oral Daily  .  gabapentin  1,600 mg Oral QHS  . gabapentin  800 mg Oral BID  . insulin aspart  0-20 Units Subcutaneous TID WC  . insulin aspart  0-5 Units Subcutaneous QHS  . insulin aspart  10 Units Subcutaneous TID WC  . insulin glargine  10 Units Subcutaneous QHS  . lactulose  20 g Oral BID  . levETIRAcetam  1,000 mg Oral TID  . lidocaine  2 patch Transdermal Q24H  . morphine  60 mg Oral Q8H  . mupirocin ointment  1 application Nasal BID  . polyethylene glycol  17 g Oral Daily  . potassium chloride  40 mEq Oral BID  . sertraline  100 mg Oral Daily  . sodium chloride flush  10-40 mL Intracatheter Q12H  . sodium chloride flush  3 mL Intravenous Q12H  . venlafaxine  75 mg Oral TID WC   Continuous Infusions: . sodium chloride 10 mL/hr at 10/29/18 1600  . azithromycin 250 mL/hr at 10/29/18 1600  . cefTRIAXone (ROCEPHIN)  IV 200 mL/hr at 10/29/18 1600      LOS: 9 days   Marinda Elkbraham Feliz Ortiz  [facility]  10/30/2018, 8:14 AM

## 2018-10-30 NOTE — Plan of Care (Signed)

## 2018-10-31 LAB — CBC WITH DIFFERENTIAL/PLATELET
Abs Immature Granulocytes: 0.07 10*3/uL (ref 0.00–0.07)
Basophils Absolute: 0 10*3/uL (ref 0.0–0.1)
Basophils Relative: 0 %
Eosinophils Absolute: 0 10*3/uL (ref 0.0–0.5)
Eosinophils Relative: 0 %
HCT: 37.7 % — ABNORMAL LOW (ref 39.0–52.0)
Hemoglobin: 11.2 g/dL — ABNORMAL LOW (ref 13.0–17.0)
Immature Granulocytes: 1 %
Lymphocytes Relative: 19 %
Lymphs Abs: 1.3 10*3/uL (ref 0.7–4.0)
MCH: 27.3 pg (ref 26.0–34.0)
MCHC: 29.7 g/dL — ABNORMAL LOW (ref 30.0–36.0)
MCV: 91.7 fL (ref 80.0–100.0)
Monocytes Absolute: 0.5 10*3/uL (ref 0.1–1.0)
Monocytes Relative: 8 %
Neutro Abs: 4.8 10*3/uL (ref 1.7–7.7)
Neutrophils Relative %: 72 %
Platelets: 206 10*3/uL (ref 150–400)
RBC: 4.11 MIL/uL — ABNORMAL LOW (ref 4.22–5.81)
RDW: 15.1 % (ref 11.5–15.5)
WBC: 6.7 10*3/uL (ref 4.0–10.5)
nRBC: 0.3 % — ABNORMAL HIGH (ref 0.0–0.2)

## 2018-10-31 LAB — COMPREHENSIVE METABOLIC PANEL
ALT: 34 U/L (ref 0–44)
AST: 21 U/L (ref 15–41)
Albumin: 3 g/dL — ABNORMAL LOW (ref 3.5–5.0)
Alkaline Phosphatase: 62 U/L (ref 38–126)
Anion gap: 9 (ref 5–15)
BUN: 16 mg/dL (ref 6–20)
CO2: 31 mmol/L (ref 22–32)
Calcium: 8.4 mg/dL — ABNORMAL LOW (ref 8.9–10.3)
Chloride: 101 mmol/L (ref 98–111)
Creatinine, Ser: 0.58 mg/dL — ABNORMAL LOW (ref 0.61–1.24)
GFR calc Af Amer: >60 mL/min (ref >60)
GFR calc non Af Amer: >60 mL/min (ref >60)
Glucose, Bld: 92 mg/dL (ref 70–99)
Potassium: 4.3 mmol/L (ref 3.5–5.1)
Sodium: 141 mmol/L (ref 135–145)
Total Bilirubin: 0.2 mg/dL — ABNORMAL LOW (ref 0.3–1.2)
Total Protein: 5.8 g/dL — ABNORMAL LOW (ref 6.5–8.1)

## 2018-10-31 LAB — GLUCOSE, CAPILLARY
Glucose-Capillary: 108 mg/dL — ABNORMAL HIGH (ref 70–99)
Glucose-Capillary: 113 mg/dL — ABNORMAL HIGH (ref 70–99)
Glucose-Capillary: 162 mg/dL — ABNORMAL HIGH (ref 70–99)
Glucose-Capillary: 87 mg/dL (ref 70–99)
Glucose-Capillary: 121 mg/dL — ABNORMAL HIGH (ref 70–99)

## 2018-10-31 LAB — FERRITIN: Ferritin: 735 ng/mL — ABNORMAL HIGH (ref 24–336)

## 2018-10-31 LAB — MAGNESIUM: Magnesium: 2 mg/dL (ref 1.7–2.4)

## 2018-10-31 MED ORDER — FLUCONAZOLE 100MG IVPB
100.0000 mg | INTRAVENOUS | Status: DC
  Filled 2018-10-31: qty 50

## 2018-10-31 MED ORDER — INSULIN GLARGINE 100 UNIT/ML ~~LOC~~ SOLN
5.0000 [IU] | Freq: Every day | SUBCUTANEOUS | Status: DC
  Administered 2018-10-31: 5 [IU] via SUBCUTANEOUS
  Filled 2018-10-31: qty 0.05

## 2018-10-31 MED ORDER — GABAPENTIN 800 MG PO TABS: 800.0000 mg | ORAL_TABLET | Freq: Two times a day (BID) | ORAL | Status: DC

## 2018-10-31 MED ORDER — INSULIN ASPART 100 UNIT/ML ~~LOC~~ SOLN
5.0000 [IU] | Freq: Three times a day (TID) | SUBCUTANEOUS | Status: DC
  Administered 2018-10-31 – 2018-11-01 (×4): 5 [IU] via SUBCUTANEOUS

## 2018-10-31 MED ORDER — DOXYCYCLINE HYCLATE 100 MG PO TABS
100.0000 mg | ORAL_TABLET | Freq: Two times a day (BID) | ORAL | Status: DC
  Administered 2018-10-31 – 2018-11-01 (×3): 100 mg via ORAL
  Filled 2018-10-31 (×6): qty 1

## 2018-10-31 MED ORDER — FLUCONAZOLE IN SODIUM CHLORIDE 200-0.9 MG/100ML-% IV SOLN
200.0000 mg | Freq: Once | INTRAVENOUS | Status: AC
  Administered 2018-10-31: 200 mg via INTRAVENOUS
  Filled 2018-10-31: qty 100

## 2018-10-31 MED ORDER — AMOXICILLIN-POT CLAVULANATE 875-125 MG PO TABS
1.0000 | ORAL_TABLET | Freq: Two times a day (BID) | ORAL | Status: DC
  Administered 2018-10-31 – 2018-11-01 (×2): 1 via ORAL
  Filled 2018-10-31 (×4): qty 1

## 2018-10-31 NOTE — Plan of Care (Signed)

## 2018-10-31 NOTE — Progress Notes (Signed)
[facility] PROGRESS NOTE    Progress Note  Luke Phillips  YQM:[id number] DOB: 06-18-1960 DOA: 10/21/2018 PCP: System, Provider Not In     Brief Narrative:   Luke AlexandersRandall Phillips is an 58 y.o. male past medical history of severe rheumatoid arthritis, bedbound on long term prednisone, COPD 4 L oxygen dependent, diastolic heart failure, and essential hypertension who was discovered to have SARS-CoV-2 4 days prior and outbreak at a nursing home facility which led everyone being tested and sent to the emergency room on 10/17/2018.  Patient was brought to the ED for fever unable to keep his oxygen saturations above 90% on 10 L high flow.  Started on dexamethasone and Remdesivir.  X-ray done on admission showed left lower lobe infiltrate.  And he was now giving Actemra as he had leukopenia and thrombocytopenia.  Significant events: He completed his course of IV Remdesivir. Single dose of IV Actemra on 10/22/2018 Continue oral dexamethasone for a total of 10 days. Assessment/Plan:   Acute on chronic respiratory failure with hypoxia and hypercapnia due to Pneumonia due to COVID-19 virus and healthcare associated pneumonia: He has multiple risk factors for acute decompensation and multiorgan failure. His oxygen saturation continues to improve he is currently on his baseline needs of 4 L of oxygen. He has completed his course of oral dexamethasone.  Healthcare associated pneumonia: His night sweats has resolved, he has remained afebrile. We will de-escalate his antibiotic coverage to oral Augmentin and doxycycline.  Watch for 24 hours to see if he remains afebrile. Continue flutter valve.  Anxiety and depression: Continue current regimen: Remains at baseline.  Chronic pain syndrome: Pain is well controlled continue current regimen.  Diabetes mellitus type 2: A1c is 7.4, blood glucose well controlled, he is off his steroids today, decreases meal coverage and long-acting insulin.     Essential hypertension: Blood pressure and heart rate are improved today. Continue current regimen.  Hypokalemia: Replete orally now resolved.  Rheumatoid arthritis: He is currently on steroids, was given Actemra on 10/22/2018.  DVT prophylaxis: lovenox Family Communication:none Disposition Plan/Barrier to D/C: Probably skilled nursing facility at 11/01/2018 Code Status:     Code Status Orders  (From admission, onward)         Start     Ordered   10/21/18 1429  Full code  Continuous     10/21/18 1428        Code Status History    This patient has a current code status but no historical code status.   Advance Care Planning Activity    Advance Directive Documentation     Most Recent Value  Type of Advance Directive  Out of facility DNR (pink MOST or yellow form)  Pre-existing out of facility DNR order (yellow form or pink MOST form)  Pink MOST form placed in chart (order not valid for inpatient use)  "MOST" Form in Place?  -        IV Access:    Peripheral IV   Procedures and diagnostic studies:   No results found.   Medical Consultants:    None.  Anti-Infectives:   None  Subjective:    Luke Alexandersandall [place] he relates he feels better today, but he is resistant to be discharged to skilled nursing facility.  Objective:    Vitals:   10/30/18 0925 10/30/18 1045 10/30/18 2030 10/31/18 0420  BP:    128/72  Pulse: 95 96 79 81  Resp: (!) 21 18 11 13   Temp:   97.7 F (36.5  C) 97.8 F (36.6 C)  TempSrc:      SpO2: 98% 99% 97% 99%  Weight:      Height:       SpO2: 99 % O2 Flow Rate (L/min): 5 L/min   Intake/Output Summary (Last 24 hours) at 10/31/2018 0715 Last data filed at 10/31/2018 0300 Gross per 24 hour  Intake 999.96 ml  Output 1050 ml  Net -50.04 ml   Filed Weights   10/21/18 0657  Weight: 79.4 kg    Exam: General exam: In no acute distress. Respiratory system: Good air movement and diffuse crackles bilaterally Cardiovascular system:  S1 & S2 heard, RRR. No JVD. Gastrointestinal system: Abdomen is nondistended, soft and nontender.  Central nervous system: Alert and oriented. No focal neurological deficits. Extremities: No pedal edema. Skin: No rashes, lesions or ulcers Psychiatry: Judgement and insight appear normal. Mood & affect appropriate.    Data Reviewed:    Labs: Basic Metabolic Panel: Recent Labs  Lab 10/25/18 0500 10/26/18 0844 10/27/18 0557 10/28/18 0714 10/29/18 0510 10/30/18 0828 10/31/18 0430  NA 140 140 142 141 143 140 141  K 4.1 3.8 4.0 4.3 4.4 4.1 4.3  CL 97* 97* 99 99 103 98 101  CO2 34* 31 34* 33* 34* 31 31  GLUCOSE 89 125* 125* 168* 110* 120* 92  BUN 19 21* 20 21* 20 20 16   CREATININE 0.60* 0.60* 0.60* 0.67 0.59* 0.67 0.58*  CALCIUM 8.5* 8.6* 8.8* 8.9 8.5* 8.7* 8.4*  MG 1.8 1.7 2.0 2.1 2.0 2.1 2.0  PHOS 2.8 2.4* 2.6 3.1  --   --   --    GFR Estimated Creatinine Clearance: 111.7 mL/min (A) (by C-G formula based on SCr of 0.58 mg/dL (L)). Liver Function Tests: Recent Labs  Lab 10/27/18 0557 10/28/18 0714 10/29/18 0510 10/30/18 0828 10/31/18 0430  AST 29 25 23 22 21   ALT 45* 47* 41 35 34  ALKPHOS 69 77 68 69 62  BILITOT 0.4 0.4 0.4 0.6 0.2*  PROT 7.0 7.3 6.5 6.6 5.8*  ALBUMIN 3.3* 3.6 3.2* 3.3* 3.0*   No results for input(s): LIPASE, AMYLASE in the last 168 hours. No results for input(s): AMMONIA in the last 168 hours. Coagulation profile No results for input(s): INR, PROTIME in the last 168 hours. COVID-19 Labs  Recent Labs    10/29/18 0510 10/30/18 0645 10/30/18 1130 10/31/18 0430  FERRITIN 583* 853*  --  735*  LDH  --   --  161  --     Lab Results  Component Value Date   SARSCOV2NAA POSITIVE (A) 10/21/2018    CBC: Recent Labs  Lab 10/27/18 0557 10/28/18 0714 10/29/18 0510 10/30/18 0645 10/31/18 0430  WBC 7.4 9.7 8.7 6.6 6.7  NEUTROABS 4.1 5.4 5.4 3.7 4.8  HGB 13.3 14.5 13.2 12.5* 11.2*  HCT 43.7 47.0 43.0 41.1 37.7*  MCV 88.8 89.7 90.3 91.3 91.7   PLT 244 279 267 244 206   Cardiac Enzymes: Recent Labs  Lab 10/25/18 0500 10/26/18 0844 10/27/18 0557 10/28/18 0714  CKTOTAL 29* 38* 33* 43*   BNP (last 3 results) No results for input(s): PROBNP in the last 8760 hours. CBG: Recent Labs  Lab 10/29/18 1653 10/29/18 2112 10/30/18 1141 10/30/18 1718 10/30/18 2134  GLUCAP 108* 125* 158* 221* 189*   D-Dimer: No results for input(s): DDIMER in the last 72 hours. Hgb A1c: No results for input(s): HGBA1C in the last 72 hours. Lipid Profile: No results for input(s): CHOL, HDL, LDLCALC, TRIG, CHOLHDL,  LDLDIRECT in the last 72 hours. Thyroid function studies: No results for input(s): TSH, T4TOTAL, T3FREE, THYROIDAB in the last 72 hours.  Invalid input(s): FREET3 Anemia work up: Recent Labs    10/30/18 0645 10/31/18 0430  FERRITIN 853* 735*   Sepsis Labs: Recent Labs  Lab 10/28/18 0714 10/29/18 0510 10/30/18 0645 10/31/18 0430  WBC 9.7 8.7 6.6 6.7   Microbiology Recent Results (from the past 240 hour(s))  Culture, blood (routine x 2)     Status: None   Collection Time: 10/21/18  8:55 AM   Specimen: BLOOD  Result Value Ref Range Status   Specimen Description   Final    BLOOD PORTA CATH Performed at [facility], [address] [place], [place] [address]    Special Requests   Final    BOTTLES DRAWN AEROBIC AND ANAEROBIC Blood Culture adequate volume Performed at [facility], [address] [place], [place] [address]    Culture   Final    NO GROWTH 5 DAYS Performed at [facility], [address] [place], [place] [address]    Report Status 10/26/2018 FINAL  Final  Culture, blood (routine x 2)     Status: None   Collection Time: 10/21/18  8:56 AM   Specimen: BLOOD  Result Value Ref Range Status   Specimen Description   Final    BLOOD PORTA CATH Performed at [facility], [address] [place], [place] [address]    Special Requests   Final     BOTTLES DRAWN AEROBIC AND ANAEROBIC Blood Culture adequate volume Performed at [facility], [address], [place], [place] [address]    Culture   Final    NO GROWTH 5 DAYS Performed at [facility], [address], [place], [place] [address]    Report Status 10/26/2018 FINAL  Final  SARS Coronavirus 2 (CEPHEID - Performed in [facility] hospital lab), Hosp Order     Status: Abnormal   Collection Time: 10/21/18  4:54 PM   Specimen: Nasopharyngeal Swab  Result Value Ref Range Status   SARS Coronavirus 2 POSITIVE (A) NEGATIVE Final    Comment: RESULT CALLED TO, READ BACK BY AND VERIFIED WITH: FRICKEY,J. RN @1920  ON 07.24.2020 BY COHEN,K (NOTE) If result is NEGATIVE SARS-CoV-2 target nucleic acids are NOT DETECTED. The SARS-CoV-2 RNA is generally detectable in upper and lower  respiratory specimens during the acute phase of infection. The lowest  concentration of SARS-CoV-2 viral copies this assay can detect is 250  copies / mL. A negative result does not preclude SARS-CoV-2 infection  and should not be used as the sole basis for treatment or other  patient management decisions.  A negative result may occur with  improper specimen collection / handling, submission of specimen other  than nasopharyngeal swab, presence of viral mutation(s) within the  areas targeted by this assay, and inadequate number of viral copies  (<250 copies / mL). A negative result must be combined with clinical  observations, patient history, and epidemiological information. If result is POSITIVE SARS-CoV-2 target nucleic acids are DET ECTED. The SARS-CoV-2 RNA is generally detectable in upper and lower  respiratory specimens during the acute phase of infection.  Positive  results are indicative of active infection with SARS-CoV-2.  Clinical  correlation with patient history and other diagnostic information is  necessary to determine patient infection status.  Positive results do   not rule out bacterial infection or co-infection  with other viruses. If result is PRESUMPTIVE POSTIVE SARS-CoV-2 nucleic acids MAY BE PRESENT.   A presumptive positive result was obtained on the submitted specimen  and confirmed on repeat testing.  While 2019 novel coronavirus  (SARS-CoV-2) nucleic acids may be present in the submitted sample  additional confirmatory testing may be necessary for epidemiological  and / or clinical management purposes  to differentiate between  SARS-CoV-2 and other Sarbecovirus currently known to infect humans.  If clinically indicated additional testing with an alternate test  methodology ([place]) is advised. The SARS-CoV-2 RNA is generally  detectable in upper and lower respiratory specimens during the acute  phase of infection. The expected result is Negative. Fact Sheet for Patients:  StrictlyIdeas.no Fact Sheet for Healthcare Providers: BankingDealers.co.za This test is not yet approved or cleared by the [place] FDA and has been authorized for detection and/or diagnosis of SARS-CoV-2 by FDA under an Emergency Use Authorization (EUA).  This EUA will remain in effect (meaning this test can be used) for the duration of the COVID-19 declaration under Section 564(b)(1) of the Act, 21 U.S.C. section 360bbb-3(b)(1), unless the authorization is terminated or revoked sooner. Performed at [facility], [place] [address], [place], [place] [address]   Culture, sputum-assessment     Status: None   Collection Time: 10/28/18  8:40 AM   Specimen: Sputum  Result Value Ref Range Status   Specimen Description SPU EXPECTORATED  Final   Special Requests NONE  Final   Sputum evaluation   Final    THIS SPECIMEN IS ACCEPTABLE FOR SPUTUM CULTURE Performed at [facility], [place] [address], [place], [place] [address]    Report Status 10/28/2018 FINAL  Final  Culture, respiratory      Status: None (Preliminary result)   Collection Time: 10/28/18  8:40 AM   Specimen: Sputum  Result Value Ref Range Status   Specimen Description   Final    SPU EXPECTORATED Performed at [place] [address], [place], [place] [address]    Special Requests   Final    NONE Reflexed from [phone] Performed at [facility], [place] [address], [place], [place] [address]    Gram Stain   Final    RARE WBC PRESENT,BOTH PMN AND MONONUCLEAR MODERATE GRAM POSITIVE COCCI IN PAIRS IN CLUSTERS    Culture   Final    CULTURE REINCUBATED FOR BETTER GROWTH Performed at [facility], [place] [address], [place], [place] [address]    Report Status PENDING  Incomplete  Culture, blood (routine x 2) Call MD if unable to obtain prior to antibiotics being given     Status: None (Preliminary result)   Collection Time: 10/28/18  9:05 AM   Specimen: BLOOD  Result Value Ref Range Status   Specimen Description   Final    BLOOD RIGHT HAND Performed at [place] [address], [place], [place] [address]    Special Requests   Final    BOTTLES DRAWN AEROBIC ONLY Blood Culture adequate volume Performed at [place] [address] [place], [place] [address]    Culture   Final    NO GROWTH 2 DAYS Performed at [place] [address], [place],  [address]    Report Status PENDING  Incomplete  Culture, blood (Routine X 2) w Reflex to ID Panel     Status: None (Preliminary result)   Collection Time: 10/28/18  9:10 AM   Specimen: BLOOD  Result Value Ref Range Status   Specimen Description   Final    BLOOD LEFT ARM Performed at [facility], [address] [place], [place] [address]    Special Requests   Final    BOTTLES DRAWN AEROBIC ONLY Blood Culture adequate volume Performed at [facility], [address] [place], [place] [address]    Culture   Final    NO  GROWTH 2 DAYS Performed at [facility], [address] [place], [place] [address]    Report Status PENDING  Incomplete  MRSA PCR Screening     Status: Abnormal   Collection Time: 10/29/18  2:01 AM   Specimen: Nasopharyngeal  Result Value Ref Range Status   MRSA by PCR POSITIVE (A) NEGATIVE Final    Comment:        The GeneXpert MRSA Assay (FDA approved for NASAL specimens only), is one component of a comprehensive MRSA colonization surveillance program. It is not intended to diagnose MRSA infection nor to guide or monitor treatment for MRSA infections. RESULT CALLED TO, READ BACK BY AND VERIFIED WITH: S.SHEATH,RN [phone] @0922  BY V.WILKINS Performed at [facility], [address] [place], [place] [address]      Medications:   . aspirin  325 mg Oral Daily  . atorvastatin  40 mg Oral Daily  . baclofen  5 mg Oral TID  . carvedilol  25 mg Oral BID WC  . Chlorhexidine Gluconate Cloth  6 each Topical Q0600  . enoxaparin (LOVENOX) injection  40 mg Subcutaneous Q24H  . furosemide  20 mg Oral Daily  . gabapentin  1,600 mg Oral QHS  . gabapentin  800 mg Oral BID  . insulin aspart  0-20 Units Subcutaneous TID WC  . insulin aspart  0-5 Units Subcutaneous QHS  . insulin aspart  10 Units Subcutaneous TID WC  . insulin glargine  10 Units Subcutaneous QHS  . lactulose  20 g Oral BID  . levETIRAcetam  1,000 mg Oral TID  . lidocaine  2 patch Transdermal Q24H  . morphine  60 mg Oral Q8H  . mupirocin ointment  1 application Nasal BID  . polyethylene glycol  17 g Oral Daily  . potassium chloride  40 mEq Oral BID  . sertraline  100 mg Oral Daily  . sodium chloride flush  10-40 mL Intracatheter Q12H  . sodium chloride flush  3 mL Intravenous Q12H  . venlafaxine  75 mg Oral TID WC   Continuous Infusions: . sodium chloride 10 mL/hr at 10/31/18 0300  . azithromycin 250 mL/hr at 10/30/18 1700  . cefTRIAXone (ROCEPHIN)  IV 200 mL/hr at 10/30/18 1700       LOS: 10 days   Marinda Elkbraham Feliz Ortiz  [facility]  10/31/2018, 7:15 AM

## 2018-10-31 NOTE — Progress Notes (Signed)
   10/31/18 1300  Clinical Encounter Type  Visited With Family;Other (Comment) (Wife)  Visit Type Initial;Psychological support;Spiritual support  Spiritual Encounters  Spiritual Needs Emotional;Other (Comment) (Spiritual Care Conversation/Support)  Stress Factors  Family Stress Factors Health changes;Major life changes   I spoke with the patient's wife. She is very appreciative of the care that the patient is receiving and of the daily updates that she is receiving. She feels that she knows everything going on. She had a question about getting a new card to the patient's track phone to him. She also had a question about whether he would be able to bring that home with him once he was discharged.  She requested prayer.   Please, contact Spiritual Care for further assistance.   Chaplain Shanon Ace M.Div., [facility]

## 2018-11-01 DIAGNOSIS — F329 Major depressive disorder, single episode, unspecified: Secondary | ICD-10-CM

## 2018-11-01 DIAGNOSIS — F419 Anxiety disorder, unspecified: Secondary | ICD-10-CM

## 2018-11-01 LAB — CBC WITH DIFFERENTIAL/PLATELET
Abs Immature Granulocytes: 0.06 10*3/uL (ref 0.00–0.07)
Basophils Absolute: 0 10*3/uL (ref 0.0–0.1)
Basophils Relative: 1 %
Eosinophils Absolute: 0.1 10*3/uL (ref 0.0–0.5)
Eosinophils Relative: 2 %
HCT: 38.1 % — ABNORMAL LOW (ref 39.0–52.0)
Hemoglobin: 11.2 g/dL — ABNORMAL LOW (ref 13.0–17.0)
Immature Granulocytes: 1 %
Lymphocytes Relative: 36 %
Lymphs Abs: 2.1 10*3/uL (ref 0.7–4.0)
MCH: 27.7 pg (ref 26.0–34.0)
MCHC: 29.4 g/dL — ABNORMAL LOW (ref 30.0–36.0)
MCV: 94.1 fL (ref 80.0–100.0)
Monocytes Absolute: 0.5 10*3/uL (ref 0.1–1.0)
Monocytes Relative: 8 %
Neutro Abs: 3.1 10*3/uL (ref 1.7–7.7)
Neutrophils Relative %: 52 %
Platelets: 184 10*3/uL (ref 150–400)
RBC: 4.05 MIL/uL — ABNORMAL LOW (ref 4.22–5.81)
RDW: 15.9 % — ABNORMAL HIGH (ref 11.5–15.5)
WBC: 5.9 10*3/uL (ref 4.0–10.5)
nRBC: 0.5 % — ABNORMAL HIGH (ref 0.0–0.2)

## 2018-11-01 LAB — COMPREHENSIVE METABOLIC PANEL
ALT: 33 U/L (ref 0–44)
AST: 23 U/L (ref 15–41)
Albumin: 3.2 g/dL — ABNORMAL LOW (ref 3.5–5.0)
Alkaline Phosphatase: 60 U/L (ref 38–126)
Anion gap: 8 (ref 5–15)
BUN: 15 mg/dL (ref 6–20)
CO2: 32 mmol/L (ref 22–32)
Calcium: 8.7 mg/dL — ABNORMAL LOW (ref 8.9–10.3)
Chloride: 103 mmol/L (ref 98–111)
Creatinine, Ser: 0.64 mg/dL (ref 0.61–1.24)
GFR calc Af Amer: >60 mL/min (ref >60)
GFR calc non Af Amer: >60 mL/min (ref >60)
Glucose, Bld: 113 mg/dL — ABNORMAL HIGH (ref 70–99)
Potassium: 3.5 mmol/L (ref 3.5–5.1)
Sodium: 143 mmol/L (ref 135–145)
Total Bilirubin: 0.3 mg/dL (ref 0.3–1.2)
Total Protein: 5.6 g/dL — ABNORMAL LOW (ref 6.5–8.1)

## 2018-11-01 LAB — FERRITIN: Ferritin: 674 ng/mL — ABNORMAL HIGH (ref 24–336)

## 2018-11-01 LAB — MAGNESIUM: Magnesium: 2 mg/dL (ref 1.7–2.4)

## 2018-11-01 LAB — GLUCOSE, CAPILLARY: Glucose-Capillary: 102 mg/dL — ABNORMAL HIGH (ref 70–99)

## 2018-11-01 MED ORDER — AMOXICILLIN-POT CLAVULANATE 875-125 MG PO TABS: 1.0000 | ORAL_TABLET | Freq: Two times a day (BID) | ORAL | 0 refills | Status: AC

## 2018-11-01 MED ORDER — DIAZEPAM 5 MG PO TABS: 5.0000 mg | ORAL_TABLET | Freq: Two times a day (BID) | ORAL | 0 refills | Status: DC | PRN

## 2018-11-01 MED ORDER — MORPHINE SULFATE 30 MG PO TABS: 30.0000 mg | ORAL_TABLET | ORAL | 0 refills | Status: DC | PRN

## 2018-11-01 MED ORDER — AMOXICILLIN-POT CLAVULANATE 875-125 MG PO TABS: 1.0000 | ORAL_TABLET | Freq: Two times a day (BID) | ORAL | 0 refills | Status: DC

## 2018-11-01 MED ORDER — FLUCONAZOLE 100 MG PO TABS: 100.0000 mg | ORAL_TABLET | Freq: Every day | ORAL | 0 refills | Status: AC

## 2018-11-01 MED ORDER — BACLOFEN 10 MG PO TABS: 5.0000 mg | ORAL_TABLET | Freq: Three times a day (TID) | ORAL | 0 refills | Status: AC

## 2018-11-01 MED ORDER — METHYLPREDNISOLONE SODIUM SUCC 40 MG IJ SOLR
40.0000 mg | Freq: Two times a day (BID) | INTRAMUSCULAR | Status: DC
  Administered 2018-11-01: 40 mg via INTRAVENOUS
  Filled 2018-11-01: qty 1

## 2018-11-01 MED ORDER — FLUCONAZOLE 100 MG PO TABS: 100.0000 mg | ORAL_TABLET | Freq: Every day | ORAL | 0 refills | Status: DC

## 2018-11-01 MED ORDER — MORPHINE SULFATE ER 60 MG PO TBCR: 60.0000 mg | EXTENDED_RELEASE_TABLET | Freq: Three times a day (TID) | ORAL | 0 refills | Status: DC

## 2018-11-01 MED ORDER — HEPARIN SOD (PORK) LOCK FLUSH 100 UNIT/ML IV SOLN
500.0000 [IU] | Freq: Once | INTRAVENOUS | Status: DC
  Filled 2018-11-01: qty 5

## 2018-11-01 MED ORDER — PREDNISONE 10 MG PO TABS: 10.0000 mg | ORAL_TABLET | Freq: Every day | ORAL | 0 refills | Status: DC

## 2018-11-01 MED ORDER — HEPARIN SOD (PORK) LOCK FLUSH 10 UNIT/ML IV SOLN
5.0000 [IU] | Freq: Once | INTRAVENOUS | Status: DC
  Filled 2018-11-01: qty 1

## 2018-11-01 MED ORDER — DIAZEPAM 5 MG PO TABS: 5.0000 mg | ORAL_TABLET | Freq: Two times a day (BID) | ORAL | 0 refills | Status: AC | PRN

## 2018-11-01 MED ORDER — DOXYCYCLINE HYCLATE 100 MG PO TABS: 100.0000 mg | ORAL_TABLET | Freq: Two times a day (BID) | ORAL | 0 refills | Status: AC

## 2018-11-01 MED ORDER — MORPHINE SULFATE 30 MG PO TABS: 30.0000 mg | ORAL_TABLET | ORAL | 0 refills | Status: AC | PRN

## 2018-11-01 MED ORDER — PREDNISONE 10 MG PO TABS: 10.0000 mg | ORAL_TABLET | Freq: Every day | ORAL | 0 refills | Status: AC

## 2018-11-01 MED ORDER — MORPHINE SULFATE ER 60 MG PO TBCR: 60.0000 mg | EXTENDED_RELEASE_TABLET | Freq: Three times a day (TID) | ORAL | 0 refills | Status: AC

## 2018-11-01 MED ORDER — DOXYCYCLINE HYCLATE 100 MG PO TABS: 100.0000 mg | ORAL_TABLET | Freq: Two times a day (BID) | ORAL | 0 refills | Status: DC

## 2018-11-01 NOTE — Plan of Care (Signed)
Report called to Spry, RN. All questions answered.

## 2018-11-01 NOTE — TOC Transition Note (Signed)
Transition of Care [facility]) - CM/SW Discharge Note   Patient Details  Name: Chadwin Fury MRN: [id number] Date of Birth: Jan 13, 1961  [facility] CM/SW Contact:  Weston Anna, LCSW Phone Number: 11/01/2018, 10:43 AM   Clinical Narrative:     Patient set to discharge to [facility] today. CSW notified patient and spouse, Darnelle Bos, and are agreeable to discharge. CSW notified RN and PTAR contacted for transportation. Please call report to [phone]    Barriers to Discharge: Continued Medical Work up   Patient Goals and CMS Choice   CMS Medicare.gov Compare Post Acute Care list provided to:: Patient Represenative (must comment)(spouse Daphine) Choice offered to / list presented to : Spouse(spouse Daphine)  Discharge Placement                       Discharge Plan and Services     Post Acute Care Choice: [place]                               Social Determinants of Health (SDOH) Interventions     Readmission Risk Interventions No flowsheet data found.

## 2018-11-01 NOTE — Discharge Summary (Addendum)
Physician Discharge Summary  Jill AlexandersRandall Saraceno ZOX:[id number] DOB: [birth year] DOA: 10/21/2018  PCP: System, Provider Not In  Admit date: 10/21/2018 Discharge date: 11/01/2018  Admitted From: SNF Disposition:  SNF  Recommendations for Outpatient Follow-up:  1. Follow up with PCP in 1-2 weeks 2. Please obtain BMP/CBC in one week   Home Health:No Equipment/Devices:None  Discharge Condition:Stable CODE STATUS:Full  Diet recommendation: Heart Healthy   Brief/Interim Summary: 58 y.o. male past medical history of severe rheumatoid arthritis, bedbound on long term prednisone, COPD 4 L oxygen dependent, diastolic heart failure, and essential hypertension who was discovered to have SARS-CoV-2 4 days prior and outbreak at a nursing home facility which led everyone being tested and sent to the emergency room on 10/17/2018.  Patient was brought to the ED for fever unable to keep his oxygen saturations above 90% on 10 L high flow.  Started on dexamethasone and Remdesivir.  X-ray done on admission showed left lower lobe infiltrate.  And he was now giving Actemra as he had leukopenia and thrombocytopenia.  Discharge Diagnoses:  Principal Problem:   Pneumonia due to COVID-19 virus Active Problems:   Acute on chronic respiratory failure with hypoxia and hypercapnia (HCC)   Anxiety and depression   Chronic pain syndrome   COPD (chronic obstructive pulmonary disease) (HCC)   Diabetes mellitus type 2 in obese (HCC)   Dyslipidemia   Hypertension   Hypokalemia   OSA (obstructive sleep apnea)   Rheumatoid arthritis involving multiple sites (HCC)   Acute respiratory disease due to COVID-19 virus   Acute on chronic respiratory failure with hypoxia and hypercarbia due to COVID-19 virus initially and later due to healthcare associated pneumonia: Him multiple risk factors for acute decompensation multiorgan failure, he was started on oxygen, started on IV Solu-Medrol and IV Remdesivir. He completed his  treatment in house. Steroids were changed to oral and he will continue on his home dose of steroids as an outpatient.  Healthcare associated pneumonia: He started having night sweats but remained afebrile his white blood cell count increased, a chest x-ray was done that was noted new infiltrate he was started empirically on IV Rocephin and azithromycin, over the next few days he improved, he was be sent home on oral Augmentin for a total of 7 days.  Anxiety and depression: No changes made to his medication.  Chronic pain syndrome: No med changes made to his medication.  Diabetes mellitus type 2: His A1c was 7.4 blood glucose remained controlled he will continue his current home regimen.  Essential hypertension: No changes made to his medication.  Hypokalemia: Likely due to decreased intravascular volume repleted now resolved.  Rheumatoid arthritis: Continue prednisone. Discharge Instructions  Discharge Instructions    Diet - low sodium heart healthy   Complete by: As directed    Increase activity slowly   Complete by: As directed    MyChart COVID-19 home monitoring program   Complete by: Nov 01, 2018    Is the patient willing to use the MyChart Mobile App for home monitoring?: Yes     Allergies as of 11/01/2018      Reactions   Sulfasalazine Itching, Rash, Swelling   Methotrexate Derivatives       Medication List    TAKE these medications   albuterol 108 (90 Base) MCG/ACT inhaler Commonly known as: VENTOLIN HFA Inhale into the lungs every 4 (four) hours as needed for wheezing or shortness of breath.   amoxicillin-clavulanate 875-125 MG tablet Commonly known as: AUGMENTIN Take 1 tablet  by mouth every 12 (twelve) hours for 2 days.   aspirin 325 MG tablet Take 325 mg by mouth daily.   atorvastatin 40 MG tablet Commonly known as: LIPITOR Take 40 mg by mouth daily.   baclofen 10 MG tablet Commonly known as: LIORESAL Take 0.5 tablets (5 mg total) by mouth 3  (three) times daily.   carvedilol 6.25 MG tablet Commonly known as: COREG Take 6.25 mg by mouth 2 (two) times daily with a meal.   diazepam 5 MG tablet Commonly known as: VALIUM Take 1 tablet (5 mg total) by mouth 2 (two) times daily as needed for anxiety.   diphenhydrAMINE 25 MG tablet Commonly known as: BENADRYL Take 25 mg by mouth daily as needed for allergies.   doxycycline 100 MG tablet Commonly known as: VIBRA-TABS Take 1 tablet (100 mg total) by mouth every 12 (twelve) hours for 4 days.   EQ CAPSAICIN PATCH EX Apply 1 patch topically at bedtime. Right forearm   ASPERCREME PAIN RELIEF PATCH EX Apply 1 patch topically daily as needed (back pain).   fluconazole 100 MG tablet Commonly known as: Diflucan Take 1 tablet (100 mg total) by mouth daily for 7 days.   furosemide 20 MG tablet Commonly known as: LASIX Take 20 mg by mouth daily.   gabapentin 800 MG tablet Commonly known as: NEURONTIN Take 1,600 mg by mouth at bedtime.   gabapentin 800 MG tablet Commonly known as: NEURONTIN Take 800 mg by mouth 2 (two) times daily.   guaifenesin 100 MG/5ML syrup Commonly known as: ROBITUSSIN Take 200 mg by mouth every 4 (four) hours as needed for cough. What changed: Another medication with the same name was removed. Continue taking this medication, and follow the directions you see here.   insulin glargine 100 UNIT/ML injection Commonly known as: LANTUS Inject 5 Units into the skin at bedtime.   insulin lispro 100 UNIT/ML injection Commonly known as: HUMALOG Inject 4-12 Units into the skin 4 (four) times daily as needed for high blood sugar (sliding scale). 60-200 0 units 201-250 4 units 251-300 6 units 301-350 8 units 351-400 10 units 401+ 12 units   ipratropium-albuterol 0.5-2.5 (3) MG/3ML Soln Commonly known as: DUONEB Take 3 mLs by nebulization 3 (three) times daily.   lactulose 10 GM/15ML solution Commonly known as: CHRONULAC Take 20 g by mouth 2 (two)  times daily.   levETIRAcetam 1000 MG tablet Commonly known as: KEPPRA Take 1,000 mg by mouth 3 (three) times daily.   levofloxacin 750 MG tablet Commonly known as: LEVAQUIN Take 750 mg by mouth daily. 7 day supply started on 10/18/2018   lidocaine 5 % Commonly known as: LIDODERM Place 1 patch onto the skin daily. Remove & Discard patch within 12 hours or as directed by MD  Apply to mid and lower back   morphine 60 MG 12 hr tablet Commonly known as: MS CONTIN Take 1 tablet (60 mg total) by mouth every 8 (eight) hours.   morphine 30 MG tablet Commonly known as: MSIR Take 1 tablet (30 mg total) by mouth as needed for severe pain. Every 2 hours PRN   ondansetron 4 MG tablet Commonly known as: ZOFRAN Take 4 mg by mouth every 6 (six) hours as needed for nausea or vomiting.   polyethylene glycol 17 g packet Commonly known as: MIRALAX / GLYCOLAX Take 17 g by mouth daily.   polyvinyl alcohol 1.4 % ophthalmic solution Commonly known as: LIQUIFILM TEARS Place 2 drops into both eyes as needed  for dry eyes.   potassium chloride SA 20 MEQ tablet Commonly known as: K-DUR Take 20 mEq by mouth daily.   predniSONE 10 MG tablet Commonly known as: DELTASONE Take 1 tablet (10 mg total) by mouth daily with breakfast. Take 30 mg daily for   2days, then 20 mg daily for 3 days then decrease to 10 mg daily from there on. What changed: additional instructions   sertraline 100 MG tablet Commonly known as: ZOLOFT Take 100 mg by mouth daily.   tiZANidine 4 MG tablet Commonly known as: ZANAFLEX Take 4 mg by mouth every 8 (eight) hours as needed for muscle spasms.   venlafaxine 75 MG tablet Commonly known as: EFFEXOR Take 75 mg by mouth 3 (three) times daily with meals.       Allergies  Allergen Reactions  . Sulfasalazine Itching, Rash and Swelling  . Methotrexate Derivatives     Consultations:  None   Procedures/Studies: Ct Chest Wo Contrast  Result Date:  10/28/2018 CLINICAL DATA:  New onset cough.  COVID-19 infection. EXAM: CT CHEST WITHOUT CONTRAST TECHNIQUE: Multidetector CT imaging of the chest was performed following the standard protocol without IV contrast. COMPARISON:  One view chest 10/21/2018. FINDINGS: Cardiovascular: Atherosclerosis of the aorta, great vessels and coronary arteries. Right subclavian Port-A-Cath extends to the mid SVC. The heart size is normal. There is no pericardial effusion. Mediastinum/Nodes: There are no enlarged mediastinal, hilar or axillary lymph nodes.1.4 cm left thyroid nodule on image 16/3. The trachea and esophagus appear normal. Lungs/Pleura: No pleural effusion or pneumothorax. There is mild pleural thickening posteriorly in the lower left chest. There is moderate centrilobular and paraseptal emphysema with diffuse central airway thickening. There is an endobronchial lesion within the central aspect of the right middle lobe bronchus, measuring 8 mm on image 38/5. There is mild volume loss in the right middle lobe. There is greater volume loss in the left lower lobe with subpleural opacity favoring rounded atelectasis. Similar subpleural opacity is seen in the left upper lobe along the major fissure. There is a collection of small cavitary lesions at the left lung apex measuring up to 18 mm on coronal image 75/602. Upper abdomen: No acute findings are seen within the visualized upper abdomen. There are several renal calculi extending into the left renal pelvis. There is a 4.1 cm left renal cyst and aortic atherosclerosis. Musculoskeletal/Chest wall: Diffuse muscular atrophy noted. The bones are diffusely demineralized. There are multiple old fractures involving the mid sternal body and multiple ribs. There are multiple thoracic compression deformities, most severe at T6 and T8. There are lesser fractures involving the T7, T9, T11, T12 and L1 vertebral bodies. No definite acute osseous findings. IMPRESSION: 1. No typical  findings of acute viral pneumonia. 2. Chronic lung disease with emphysema and diffuse central airway thickening. Small nonspecific endobronchial lesion in the right middle lobe bronchus with minimal volume loss. 3. More extensive volume loss and subpleural opacity in the left upper and lower lobes with adjacent pleural thickening. Given evidence of significant previous chest trauma, these findings may reflect rounded atelectasis or posttraumatic scarring. Radiographic follow up recommended. 4. Multiple fractures of the ribs, sternum and thoracolumbar spine. 5. 14 mm left thyroid nodule. Consider further evaluation with elective thyroid ultrasound after resolution of the patient's acute illness. Electronically Signed   By: Richardean Sale M.D.   On: 10/28/2018 13:43   Portable Chest 1 View  Result Date: 10/21/2018 CLINICAL DATA:  Shortness of breath. COVID 19 pneumonia. EXAM: PORTABLE  CHEST 1 VIEW COMPARISON:  None. FINDINGS: Injectable port terminates at the cavoatrial junction Cardiomediastinal silhouette is enlarged. Calcific atherosclerotic disease of aorta. Left lower lobe atelectasis versus airspace consolidation. Healing or healed right-sided rib fractures. Soft tissues are grossly normal. IMPRESSION: 1. Left lower lobe atelectasis versus airspace consolidation. 2. Enlarged cardiac silhouette. Calcific atherosclerotic disease of aorta. Electronically Signed   By: Ted Mcalpineobrinka  Dimitrova M.D.   On: 10/21/2018 08:15      Subjective: He relates he feels tired but with little bit better than yesterday.  Discharge Exam: Vitals:   10/31/18 1945 11/01/18 0417  BP: 94/66 127/78  Pulse: 80 86  Resp: (!) 21 15  Temp: 99 F (37.2 C) 98 F (36.7 C)  SpO2: 99% 98%   Vitals:   10/31/18 1445 10/31/18 1637 10/31/18 1945 11/01/18 0417  BP:  95/77 94/66 127/78  Pulse: 93 87 80 86  Resp: 17 (!) 23 (!) 21 15  Temp:  98.5 F (36.9 C) 99 F (37.2 C) 98 F (36.7 C)  TempSrc:  Oral    SpO2: 93% 97% 99%  98%  Weight:      Height:        General: Pt is alert, awake, not in acute distress Cardiovascular: RRR, S1/S2 +, no rubs, no gallops Respiratory: CTA bilaterally, no wheezing, no rhonchi Abdominal: Soft, NT, ND, bowel sounds + Extremities: no edema, no cyanosis    The results of significant diagnostics from this hospitalization (including imaging, microbiology, ancillary and laboratory) are listed below for reference.     Microbiology: Recent Results (from the past 240 hour(s))  Culture, sputum-assessment     Status: None   Collection Time: 10/28/18  8:40 AM   Specimen: Sputum  Result Value Ref Range Status   Specimen Description SPU EXPECTORATED  Final   Special Requests NONE  Final   Sputum evaluation   Final    THIS SPECIMEN IS ACCEPTABLE FOR SPUTUM CULTURE Performed at [facility], [address] [place], [place] [address]    Report Status 10/28/2018 FINAL  Final  Culture, respiratory     Status: None (Preliminary result)   Collection Time: 10/28/18  8:40 AM   Specimen: Sputum  Result Value Ref Range Status   Specimen Description   Final    SPU EXPECTORATED Performed at [facility], [address] [place], [place] [address]    Special Requests   Final    NONE Reflexed from [id number] Performed at [facility], [address] [place], [place] [address]    Gram Stain   Final    RARE WBC PRESENT,BOTH PMN AND MONONUCLEAR MODERATE GRAM POSITIVE COCCI IN PAIRS IN CLUSTERS    Culture   Final    ABUNDANT STAPHYLOCOCCUS AUREUS CULTURE REINCUBATED FOR BETTER GROWTH Performed at [facility], [address] [place], [place] [address]    Report Status PENDING  Incomplete  Culture, blood (routine x 2) Call MD if unable to obtain prior to antibiotics being given     Status: None (Preliminary result)   Collection Time: 10/28/18  9:05 AM   Specimen: BLOOD  Result Value Ref Range Status   Specimen Description    Final    BLOOD RIGHT HAND Performed at [facility], [address] [place], [place] [address]    Special Requests   Final    BOTTLES DRAWN AEROBIC ONLY Blood Culture adequate volume Performed at [facility], [address]  [place], [place] [address]    Culture   Final    NO GROWTH 4 DAYS Performed at [facility], [address], [place], [place] [address]    Report Status PENDING  Incomplete  Culture, blood (Routine X 2) w Reflex to ID Panel     Status: None (Preliminary result)   Collection Time: 10/28/18  9:10 AM   Specimen: BLOOD  Result Value Ref Range Status   Specimen Description   Final    BLOOD LEFT ARM Performed at [facility], [address], [place], [place] [address]    Special Requests   Final    BOTTLES DRAWN AEROBIC ONLY Blood Culture adequate volume Performed at [facility], [address] [place], [place] [address]    Culture   Final    NO GROWTH 4 DAYS Performed at [facility], [address], [place], [place] [address]    Report Status PENDING  Incomplete  MRSA PCR Screening     Status: Abnormal   Collection Time: 10/29/18  2:01 AM   Specimen: Nasopharyngeal  Result Value Ref Range Status   MRSA by PCR POSITIVE (A) NEGATIVE Final    Comment:        The GeneXpert MRSA Assay (FDA approved for NASAL specimens only), is one component of a comprehensive MRSA colonization surveillance program. It is not intended to diagnose MRSA infection nor to guide or monitor treatment for MRSA infections. RESULT CALLED TO, READ BACK BY AND VERIFIED WITH: S.SHEATH,RN [id number] @0922  BY V.WILKINS Performed at [facility], [address], [place], [place]      Labs: BNP (last 3 results) No results for input(s): BNP in the last 8760 hours. Basic Metabolic Panel: Recent Labs  Lab 10/26/18 0844 10/27/18 0557 10/28/18 0714 10/29/18 0510  10/30/18 0828 10/31/18 0430 11/01/18 0500  NA 140 142 141 143 140 141 143  K 3.8 4.0 4.3 4.4 4.1 4.3 3.5  CL 97* 99 99 103 98 101 103  CO2 31 34* 33* 34* 31 31 32  GLUCOSE 125* 125* 168* 110* 120* 92 113*  BUN 21* 20 21* 20 20 16 15   CREATININE 0.60* 0.60* 0.67 0.59* 0.67 0.58* 0.64  CALCIUM 8.6* 8.8* 8.9 8.5* 8.7* 8.4* 8.7*  MG 1.7 2.0 2.1 2.0 2.1 2.0 2.0  PHOS 2.4* 2.6 3.1  --   --   --   --    Liver Function Tests: Recent Labs  Lab 10/28/18 0714 10/29/18 0510 10/30/18 0828 10/31/18 0430 11/01/18 0500  AST 25 23 22 21 23   ALT 47* 41 35 34 33  ALKPHOS 77 68 69 62 60  BILITOT 0.4 0.4 0.6 0.2* 0.3  PROT 7.3 6.5 6.6 5.8* 5.6*  ALBUMIN 3.6 3.2* 3.3* 3.0* 3.2*   No results for input(s): LIPASE, AMYLASE in the last 168 hours. No results for input(s): AMMONIA in the last 168 hours. CBC: Recent Labs  Lab 10/28/18 0714 10/29/18 0510 10/30/18 0645 10/31/18 0430 11/01/18 0500  WBC 9.7 8.7 6.6 6.7 5.9  NEUTROABS 5.4 5.4 3.7 4.8 3.1  HGB 14.5 13.2 12.5* 11.2* 11.2*  HCT 47.0 43.0 41.1 37.7* 38.1*  MCV 89.7 90.3 91.3 91.7 94.1  PLT 279 267 244 206 184   Cardiac Enzymes: Recent Labs  Lab 10/26/18 0844 10/27/18 0557 10/28/18 0714  CKTOTAL 38* 33* 43*   BNP: Invalid input(s): POCBNP CBG: Recent Labs  Lab 10/31/18 0758 10/31/18 1148 10/31/18 1630 10/31/18 2033 11/01/18  0812  GLUCAP 113* 162* 87 121* 102*   D-Dimer No results for input(s): DDIMER in the last 72 hours. Hgb A1c No results for input(s): HGBA1C in the last 72 hours. Lipid Profile No results for input(s): CHOL, HDL, LDLCALC, TRIG, CHOLHDL, LDLDIRECT in the last 72 hours. Thyroid function studies No results for input(s): TSH, T4TOTAL, T3FREE, THYROIDAB in the last 72 hours.  Invalid input(s): FREET3 Anemia work up Recent Labs    10/31/18 0430 11/01/18 0500  FERRITIN 735* 674*   Urinalysis No results found for: COLORURINE, APPEARANCEUR, LABSPEC, PHURINE, GLUCOSEU, HGBUR, BILIRUBINUR,  KETONESUR, PROTEINUR, UROBILINOGEN, NITRITE, LEUKOCYTESUR Sepsis Labs Invalid input(s): PROCALCITONIN,  WBC,  LACTICIDVEN Microbiology Recent Results (from the past 240 hour(s))  Culture, sputum-assessment     Status: None   Collection Time: 10/28/18  8:40 AM   Specimen: Sputum  Result Value Ref Range Status   Specimen Description SPU EXPECTORATED  Final   Special Requests NONE  Final   Sputum evaluation   Final    THIS SPECIMEN IS ACCEPTABLE FOR SPUTUM CULTURE Performed at [facility], [address], [place], [place] [address]    Report Status 10/28/2018 FINAL  Final  Culture, respiratory     Status: None (Preliminary result)   Collection Time: 10/28/18  8:40 AM   Specimen: Sputum  Result Value Ref Range Status   Specimen Description   Final    SPU EXPECTORATED Performed at [facility], [address], [place], [place] [address]    Special Requests   Final    NONE Reflexed from [phone] Performed at [facility], [address] [place], [place] [address]    Gram Stain   Final    RARE WBC PRESENT,BOTH PMN AND MONONUCLEAR MODERATE GRAM POSITIVE COCCI IN PAIRS IN CLUSTERS    Culture   Final    ABUNDANT STAPHYLOCOCCUS AUREUS CULTURE REINCUBATED FOR BETTER GROWTH Performed at [facility], [address], [place], [place] [address]    Report Status PENDING  Incomplete  Culture, blood (routine x 2) Call MD if unable to obtain prior to antibiotics being given     Status: None (Preliminary result)   Collection Time: 10/28/18  9:05 AM   Specimen: BLOOD  Result Value Ref Range Status   Specimen Description   Final    BLOOD RIGHT HAND Performed at [facility], [address], [place], [place] [address]    Special Requests   Final    BOTTLES DRAWN AEROBIC ONLY Blood Culture adequate volume Performed at [facility], [address] [place], [place] [address]    Culture    Final    NO GROWTH 4 DAYS Performed at [facility], [address], [place], [place] [address]    Report Status PENDING  Incomplete  Culture, blood (Routine X 2) w Reflex to ID Panel     Status: None (Preliminary result)   Collection Time: 10/28/18  9:10 AM   Specimen: BLOOD  Result Value Ref Range Status   Specimen Description   Final    BLOOD LEFT ARM Performed at [facility], [address] [place], [place] [address]    Special Requests   Final    BOTTLES DRAWN AEROBIC ONLY Blood Culture adequate volume Performed at [facility], [address] [place], [place] [address]    Culture   Final    NO GROWTH 4 DAYS Performed at [facility], [address], [place], [place] [address]    Report Status PENDING  Incomplete  MRSA PCR Screening     Status: Abnormal   Collection Time: 10/29/18  2:01 AM   Specimen: Nasopharyngeal  Result Value Ref Range Status   MRSA by PCR POSITIVE (A) NEGATIVE Final    Comment:        The GeneXpert MRSA Assay (FDA approved for NASAL specimens only), is one component of a comprehensive MRSA colonization surveillance program. It is not intended to diagnose MRSA infection nor to guide or monitor treatment for MRSA infections. RESULT CALLED TO, READ BACK BY AND VERIFIED WITH: S.SHEATH,RN [id number]  BY V.WILKINS Performed at [facility], [address] [place], [place] [address]      Time coordinating discharge: Over 40 minutes  SIGNED:   Marinda Elk, MD  [facility] 11/01/2018, 11:43 AM Pager   If 7PM-7AM, please contact night-coverage [email or web address] Password [email or web address]

## 2018-11-02 LAB — CULTURE, BLOOD (ROUTINE X 2)
Culture: NO GROWTH
Culture: NO GROWTH
Special Requests: ADEQUATE
Special Requests: ADEQUATE

## 2018-11-02 LAB — CULTURE, RESPIRATORY W GRAM STAIN

## 2019-07-05 DIAGNOSIS — J9621 Acute and chronic respiratory failure with hypoxia: Secondary | ICD-10-CM

## 2019-07-05 DIAGNOSIS — J189 Pneumonia, unspecified organism: Secondary | ICD-10-CM

## 2019-07-05 DIAGNOSIS — R6521 Severe sepsis with septic shock: Secondary | ICD-10-CM | POA: Diagnosis not present

## 2019-07-05 DIAGNOSIS — M0510 Rheumatoid lung disease with rheumatoid arthritis of unspecified site: Secondary | ICD-10-CM

## 2019-07-05 DIAGNOSIS — I5022 Chronic systolic (congestive) heart failure: Secondary | ICD-10-CM | POA: Diagnosis not present

## 2019-07-10 DIAGNOSIS — J189 Pneumonia, unspecified organism: Secondary | ICD-10-CM | POA: Diagnosis not present

## 2019-07-10 DIAGNOSIS — J9621 Acute and chronic respiratory failure with hypoxia: Secondary | ICD-10-CM

## 2019-07-10 DIAGNOSIS — I5022 Chronic systolic (congestive) heart failure: Secondary | ICD-10-CM | POA: Diagnosis not present

## 2019-07-10 DIAGNOSIS — R6521 Severe sepsis with septic shock: Secondary | ICD-10-CM

## 2019-07-10 DIAGNOSIS — M0510 Rheumatoid lung disease with rheumatoid arthritis of unspecified site: Secondary | ICD-10-CM

## 2019-07-11 DIAGNOSIS — I5022 Chronic systolic (congestive) heart failure: Secondary | ICD-10-CM | POA: Diagnosis not present

## 2019-07-11 DIAGNOSIS — M0510 Rheumatoid lung disease with rheumatoid arthritis of unspecified site: Secondary | ICD-10-CM

## 2019-07-11 DIAGNOSIS — R6521 Severe sepsis with septic shock: Secondary | ICD-10-CM

## 2019-07-11 DIAGNOSIS — J189 Pneumonia, unspecified organism: Secondary | ICD-10-CM | POA: Diagnosis not present

## 2019-07-11 DIAGNOSIS — J9621 Acute and chronic respiratory failure with hypoxia: Secondary | ICD-10-CM | POA: Diagnosis not present

## 2019-07-12 DIAGNOSIS — J189 Pneumonia, unspecified organism: Secondary | ICD-10-CM | POA: Diagnosis not present

## 2019-07-12 DIAGNOSIS — M0510 Rheumatoid lung disease with rheumatoid arthritis of unspecified site: Secondary | ICD-10-CM

## 2019-07-12 DIAGNOSIS — I5022 Chronic systolic (congestive) heart failure: Secondary | ICD-10-CM | POA: Diagnosis not present

## 2019-07-12 DIAGNOSIS — J9621 Acute and chronic respiratory failure with hypoxia: Secondary | ICD-10-CM | POA: Diagnosis not present

## 2019-07-12 DIAGNOSIS — R6521 Severe sepsis with septic shock: Secondary | ICD-10-CM | POA: Diagnosis not present

## 2019-07-13 DIAGNOSIS — R6521 Severe sepsis with septic shock: Secondary | ICD-10-CM

## 2019-07-13 DIAGNOSIS — M0510 Rheumatoid lung disease with rheumatoid arthritis of unspecified site: Secondary | ICD-10-CM

## 2019-07-13 DIAGNOSIS — J189 Pneumonia, unspecified organism: Secondary | ICD-10-CM

## 2019-07-13 DIAGNOSIS — J9621 Acute and chronic respiratory failure with hypoxia: Secondary | ICD-10-CM | POA: Diagnosis not present

## 2019-07-13 DIAGNOSIS — I5022 Chronic systolic (congestive) heart failure: Secondary | ICD-10-CM

## 2019-07-14 DIAGNOSIS — I5022 Chronic systolic (congestive) heart failure: Secondary | ICD-10-CM

## 2019-07-14 DIAGNOSIS — J189 Pneumonia, unspecified organism: Secondary | ICD-10-CM

## 2019-07-14 DIAGNOSIS — R6521 Severe sepsis with septic shock: Secondary | ICD-10-CM

## 2019-07-14 DIAGNOSIS — M0510 Rheumatoid lung disease with rheumatoid arthritis of unspecified site: Secondary | ICD-10-CM

## 2019-07-14 DIAGNOSIS — J9621 Acute and chronic respiratory failure with hypoxia: Secondary | ICD-10-CM

## 2019-07-15 DIAGNOSIS — J189 Pneumonia, unspecified organism: Secondary | ICD-10-CM | POA: Diagnosis not present

## 2019-07-15 DIAGNOSIS — I5022 Chronic systolic (congestive) heart failure: Secondary | ICD-10-CM

## 2019-07-15 DIAGNOSIS — M0510 Rheumatoid lung disease with rheumatoid arthritis of unspecified site: Secondary | ICD-10-CM

## 2019-07-15 DIAGNOSIS — R6521 Severe sepsis with septic shock: Secondary | ICD-10-CM | POA: Diagnosis not present

## 2019-07-15 DIAGNOSIS — J9621 Acute and chronic respiratory failure with hypoxia: Secondary | ICD-10-CM

## 2019-07-16 DIAGNOSIS — R6521 Severe sepsis with septic shock: Secondary | ICD-10-CM | POA: Diagnosis not present

## 2019-07-16 DIAGNOSIS — J9621 Acute and chronic respiratory failure with hypoxia: Secondary | ICD-10-CM

## 2019-07-16 DIAGNOSIS — M0510 Rheumatoid lung disease with rheumatoid arthritis of unspecified site: Secondary | ICD-10-CM

## 2019-07-16 DIAGNOSIS — I5022 Chronic systolic (congestive) heart failure: Secondary | ICD-10-CM

## 2019-07-16 DIAGNOSIS — J189 Pneumonia, unspecified organism: Secondary | ICD-10-CM | POA: Diagnosis not present

## 2019-07-29 DEATH — deceased

## 2020-09-24 IMAGING — DX PORTABLE CHEST - 1 VIEW
1 series · 1 of 1 positions shown · non-contrast
Comparison: None.

CLINICAL DATA: Shortness of breath. COVID 19 pneumonia.

EXAM:
PORTABLE CHEST 1 VIEW

[chest ap]
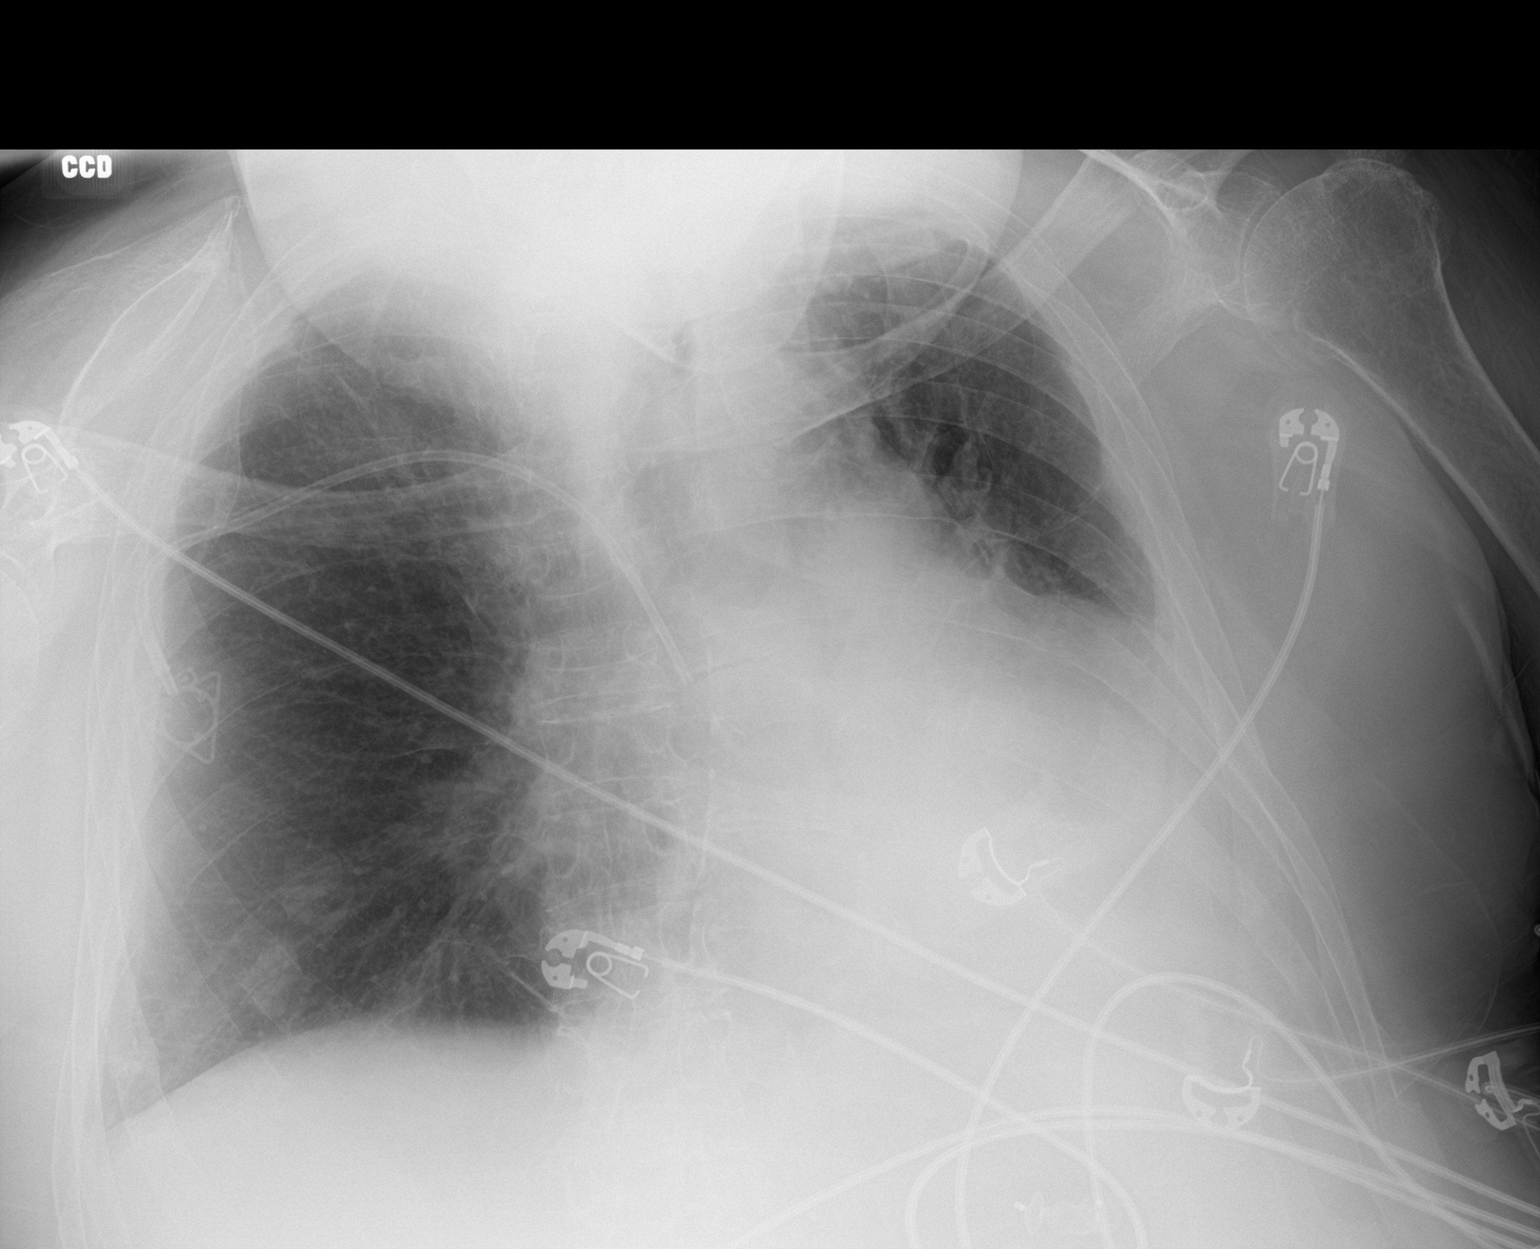

[1 of 1 positions shown; findings below may reference images not displayed]

FINDINGS: Injectable port terminates at the cavoatrial junction

Cardiomediastinal silhouette is enlarged. Calcific atherosclerotic
disease of aorta.

Left lower lobe atelectasis versus airspace consolidation.

Healing or healed right-sided rib fractures. Soft tissues are
grossly normal.
IMPRESSION: 1. Left lower lobe atelectasis versus airspace consolidation.
2. Enlarged cardiac silhouette. Calcific atherosclerotic disease of
aorta.
